# Patient Record
Sex: Female | Born: 1985 | Race: White | Hispanic: No | Marital: Single | Smoking: Current every day smoker
Health system: Southern US, Community
[De-identification: ages and names within clinical notes are randomized; demographics above are authoritative.]

## PROBLEM LIST (undated history)

## (undated) ENCOUNTER — Ambulatory Visit (HOSPITAL_COMMUNITY): Admission: EM | Payer: Medicaid Other | Source: Home / Self Care

## (undated) ENCOUNTER — Inpatient Hospital Stay (HOSPITAL_COMMUNITY): Payer: Self-pay

## (undated) DIAGNOSIS — N189 Chronic kidney disease, unspecified: Secondary | ICD-10-CM

## (undated) DIAGNOSIS — Z8619 Personal history of other infectious and parasitic diseases: Secondary | ICD-10-CM

## (undated) HISTORY — PX: WISDOM TOOTH EXTRACTION: SHX21

## (undated) HISTORY — DX: Personal history of other infectious and parasitic diseases: Z86.19

## (undated) HISTORY — PX: PLACEMENT OF BREAST IMPLANTS: SHX6334

---

## 2012-07-26 ENCOUNTER — Emergency Department (HOSPITAL_COMMUNITY)
Admission: EM | Admit: 2012-07-26 | Discharge: 2012-07-26 | Disposition: A | Discharge status: Home / Self Care | Payer: Self-pay | Attending: Emergency Medicine | Admitting: Emergency Medicine

## 2012-07-26 ENCOUNTER — Emergency Department (HOSPITAL_COMMUNITY): Payer: Self-pay

## 2012-07-26 ENCOUNTER — Encounter (HOSPITAL_COMMUNITY): Payer: Self-pay | Admitting: Physical Medicine and Rehabilitation

## 2012-07-26 DIAGNOSIS — R079 Chest pain, unspecified: Secondary | ICD-10-CM

## 2012-07-26 DIAGNOSIS — R6889 Other general symptoms and signs: Secondary | ICD-10-CM | POA: Insufficient documentation

## 2012-07-26 DIAGNOSIS — R0789 Other chest pain: Secondary | ICD-10-CM | POA: Insufficient documentation

## 2012-07-26 LAB — URINALYSIS, ROUTINE W REFLEX MICROSCOPIC
Bilirubin Urine: NEGATIVE
Hgb urine dipstick: NEGATIVE
Specific Gravity, Urine: 1.026 (ref 1.005–1.030)
Urobilinogen, UA: 0.2 mg/dL (ref 0.0–1.0)
pH: 6.5 (ref 5.0–8.0)

## 2012-07-26 LAB — COMPREHENSIVE METABOLIC PANEL
ALT: 84 U/L — ABNORMAL HIGH (ref 0–35)
AST: 149 U/L — ABNORMAL HIGH (ref 0–37)
Albumin: 3.9 g/dL (ref 3.5–5.2)
Alkaline Phosphatase: 106 U/L (ref 39–117)
Calcium: 9.2 mg/dL (ref 8.4–10.5)
GFR calc Af Amer: >90 mL/min (ref >90)
Glucose, Bld: 98 mg/dL (ref 70–99)
Potassium: 4 mEq/L (ref 3.5–5.1)
Sodium: 136 mEq/L (ref 135–145)
Total Protein: 7.3 g/dL (ref 6.0–8.3)

## 2012-07-26 LAB — CBC
Hemoglobin: 14.8 g/dL (ref 12.0–15.0)
MCH: 31.6 pg (ref 26.0–34.0)
MCHC: 35 g/dL (ref 30.0–36.0)
Platelets: 256 10*3/uL (ref 150–400)
RDW: 12.7 % (ref 11.5–15.5)
WBC: 12.1 10*3/uL — ABNORMAL HIGH (ref 4.0–10.5)

## 2012-07-26 MED ORDER — IBUPROFEN 400 MG PO TABS
600.0000 mg | ORAL_TABLET | Freq: Once | ORAL | Status: DC
  Filled 2012-07-26: qty 2

## 2012-07-26 MED ORDER — OXYCODONE-ACETAMINOPHEN 5-325 MG PO TABS: 1.0000 | ORAL_TABLET | Freq: Once | ORAL | Status: DC

## 2012-07-26 MED ORDER — ONDANSETRON 4 MG PO TBDP
8.0000 mg | ORAL_TABLET | Freq: Once | ORAL | Status: AC
  Administered 2012-07-26: 8 mg via ORAL
  Filled 2012-07-26: qty 2

## 2012-07-26 MED ORDER — GI COCKTAIL ~~LOC~~
30.0000 mL | Freq: Once | ORAL | Status: AC
  Administered 2012-07-26: 30 mL via ORAL
  Filled 2012-07-26: qty 30

## 2012-07-26 MED ORDER — ONDANSETRON HCL 4 MG PO TABS: 4.0000 mg | ORAL_TABLET | Freq: Four times a day (QID) | ORAL | Status: DC | PRN

## 2012-07-26 MED ORDER — OXYCODONE-ACETAMINOPHEN 5-325 MG PO TABS
1.0000 | ORAL_TABLET | Freq: Once | ORAL | Status: AC
  Administered 2012-07-26: 1 via ORAL
  Filled 2012-07-26: qty 1

## 2012-07-26 MED ORDER — OXYCODONE HCL ER 10 MG PO T12A: 10.0000 mg | EXTENDED_RELEASE_TABLET | Freq: Two times a day (BID) | ORAL | Status: DC

## 2012-07-26 NOTE — ED Notes (Signed)
Pt transported to ultrasound.

## 2012-07-26 NOTE — ED Notes (Signed)
Pt presents to department via [facility] for evaluation of midsternal chest pain radiating to back. Onset last night while sleeping, woke her up @2 :00am. 10/10 pain upon arrival, becomes worse with movement. Respirations unlabored. She is alert and oriented x4. 18g LAC. Received 324 ASA and 1 sublingual nitroglycerin without relief. Denies drug/ETOH abuse.

## 2012-07-26 NOTE — ED Provider Notes (Signed)
History     CSN: [id number]  Arrival date & time 07/26/12  7829   First MD Initiated Contact with Patient 07/26/12 [phone]      Chief Complaint  Patient presents with  . Chest Pain    (Consider location/radiation/quality/duration/timing/severity/associated sxs/prior treatment) HPI Comments: 26 year old female with no significant past medical history presents emergency department complaining of chest pain.  Onset of symptoms began around 2 a.m. after patient woke up.  She describes location of the pain to be in the epigastric and substernal area with radiation to her back.  Severity is rated at 10/10 and is exacerbated eye movement and deep inhalation.  Pain is described as a constant pressure associated with nausea and emesis x1.  Patient denies any shortness of breath, diaphoresis, PND, orthopnea, claudication, leg swelling, cough, hemoptysis, estrogen use, recent travel fever, night sweats, chills, drug or alcohol use, but reports occasional alcohol use. Patient received 324 ASA and 1 sublingual nitroglycerin be a mass in route without relief.  No other complaints at this time.   The history is provided by the patient.    No past medical history on file.  No past surgical history on file.  No family history on file.  History  Substance Use Topics  . Smoking status: Never Smoker   . Smokeless tobacco: Not on file  . Alcohol Use: No    OB History    Grav Para Term Preterm Abortions TAB SAB Ect Mult Living                  Review of Systems  Constitutional: Negative for fever, chills and appetite change.  HENT: Negative for congestion.   Eyes: Negative for visual disturbance.  Respiratory: Negative for shortness of breath.   Cardiovascular: Positive for chest pain. Negative for leg swelling.  Gastrointestinal: Positive for abdominal pain (epigastric).  Genitourinary: Negative for dysuria, urgency and frequency.  Neurological: Negative for dizziness, syncope, weakness,  light-headedness, numbness and headaches.  Psychiatric/Behavioral: Negative for confusion.    Allergies  Review of patient's allergies indicates no known allergies.  Home Medications   Current Outpatient Rx  Name Route Sig Dispense Refill  . ACETAMINOPHEN 500 MG PO TABS Oral Take 500 mg by mouth every 6 (six) hours as needed. Pain      BP 123/79  Pulse 65  Temp 98.3 F (36.8 C) (Oral)  Resp 23  SpO2 100%  LMP 07/05/2012  Physical Exam  Nursing note and vitals reviewed. Constitutional: She appears well-developed and well-nourished. No distress.  HENT:  Head: Normocephalic and atraumatic.  Eyes: Conjunctivae normal and EOM are normal. Pupils are equal, round, and reactive to light.  Neck: Normal range of motion. Neck supple. Normal carotid pulses and no JVD present. Carotid bruit is not present. No rigidity. Normal range of motion present.  Cardiovascular: Normal rate, regular rhythm, S1 normal, S2 normal, normal heart sounds, intact distal pulses and normal pulses.  Exam reveals no gallop and no friction rub.   No murmur heard.      No pitting edema bilaterally, RRR, no aberrant sounds on auscultations, distal pulses intact, no carotid bruit or JVD.   Pulmonary/Chest: Effort normal and breath sounds normal. No accessory muscle usage or stridor. No respiratory distress. She exhibits no tenderness and no bony tenderness.  Abdominal: Bowel sounds are normal. There is tenderness in the epigastric area.         Soft. Non pulsatile aorta.   Skin: Skin is warm, dry and intact. No  rash noted. She is not diaphoretic. No cyanosis. Nails show no clubbing.    ED Course  Procedures (including critical care time)  Labs Reviewed  URINALYSIS, ROUTINE W REFLEX MICROSCOPIC - Abnormal; Notable for the following:    Ketones, ur 15 (*)     All other components within normal limits  CBC - Abnormal; Notable for the following:    WBC 12.1 (*)     All other components within normal limits    COMPREHENSIVE METABOLIC PANEL - Abnormal; Notable for the following:    AST 149 (*)     ALT 84 (*)     GFR calc non Af Amer 87 (*)     All other components within normal limits  PREGNANCY, URINE  LIPASE, BLOOD  HEPATITIS PANEL, ACUTE   Dg Chest 2 View  07/26/2012  *RADIOLOGY REPORT*  Clinical Data: History of left chest pain.  History of shortness of breath and smoking.  CHEST - 2 VIEW  Comparison: None.  Findings: The cardiac silhouette is normal size and shape. Mediastinal and hilar contours appear stable.  No pneumothorax is evident.  No pulmonary infiltrates or masses are seen. No pleural abnormality is evident.  No skeletal lesions are evident.  IMPRESSION: No acute or active cardiopulmonary or pleural abnormalities are evident.   Original Report Authenticated By: Crawford Givens, M.D.    US Abdomen Complete  07/26/2012  *RADIOLOGY REPORT*  Clinical Data:  Epigastric pain  COMPLETE ABDOMINAL ULTRASOUND  Comparison:  None.  Findings:  Gallbladder:  No gallstones, gallbladder wall thickening, or pericholecystic fluid.  Common bile duct:  Normal at 5 mm  Liver:  No focal lesion identified.  Within normal limits in parenchymal echogenicity.  IVC:  Appears normal.  Pancreas:  No focal abnormality seen.  Spleen:  Normal in size and echogenicity.  Right Kidney:  13.5cm in length.  No evidence of hydronephrosis or stones. Potential duplicated collecting system  Left Kidney:  Moderate hydronephrosis. No stone identified.  Abdominal aorta:  Normal  Additional findings:  The bladder is collapsed.  IMPRESSION:  1.  Moderate  left hydronephrosis. 2.  Bladder is collapsed. 3.  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 4.  Normal gallbladder.   Original Report Authenticated By: Genevive Bi, M.D.      No diagnosis found.   Date: 07/26/2012  Rate: 57  Rhythm: normal sinus rhythm  QRS Axis: right, borderline  Intervals: normal  ST/T Wave abnormalities: normal  Conduction Disutrbances:  none  Narrative Interpretation:   Old EKG Reviewed: No old ECG  MDM  CP Patient is to be discharged with recommendation to follow up with PCP in regards to today's hospital visit for a re-check of her LFTs. Advised avoidance of acetaminophen and return if jaundice develops or worsening symptoms. Chest pain is not likely of cardiac or pulmonary etiology d/t presentation, perc negative, VSS, no tracheal deviation, no JVD or new murmur, RRR, breath sounds equal bilaterally, EKG without acute abnormalities, and negative CXR. Pt appears reliable for follow up and is agreeable to discharge.   Case has been discussed with and seen by Dr. Radford Pax who agrees with the above plan to discharge.          Jaci Carrel, [place] 07/26/12 1607

## 2012-07-27 LAB — HEPATITIS PANEL, ACUTE
HCV Ab: NEGATIVE
Hep A IgM: NEGATIVE
Hep B C IgM: NEGATIVE

## 2012-07-28 NOTE — ED Provider Notes (Signed)
Medical screening examination/treatment/procedure(s) were performed by non-physician practitioner and as supervising physician I was immediately available for consultation/collaboration.    Nelia Shi, MD 07/28/12 [phone]

## 2012-08-01 ENCOUNTER — Telehealth (HOSPITAL_COMMUNITY): Payer: Self-pay | Admitting: *Deleted

## 2012-08-01 NOTE — ED Notes (Signed)
Pt. called for Hepatitis results.  Pt. verified x 2 and given neg. results. Pt. said she was told to get her liver result retested in 1 week. When notes accessed I saw that pt. was seen in the ED.  Does not have a PCP or insurance. I told her she could come back here tomorrow. No appointment necessary. Vassie Moselle 08/01/2012

## 2013-05-12 DIAGNOSIS — N390 Urinary tract infection, site not specified: Secondary | ICD-10-CM | POA: Insufficient documentation

## 2013-06-23 ENCOUNTER — Inpatient Hospital Stay (HOSPITAL_COMMUNITY)
Admission: AD | Admit: 2013-06-23 | Discharge: 2013-06-23 | Disposition: A | Discharge status: Home / Self Care | Payer: Self-pay | Source: Ambulatory Visit | Attending: Obstetrics & Gynecology | Admitting: Obstetrics & Gynecology

## 2013-06-23 ENCOUNTER — Inpatient Hospital Stay (HOSPITAL_COMMUNITY): Payer: Self-pay

## 2013-06-23 ENCOUNTER — Encounter (HOSPITAL_COMMUNITY): Payer: Self-pay | Admitting: *Deleted

## 2013-06-23 DIAGNOSIS — O418X10 Other specified disorders of amniotic fluid and membranes, first trimester, not applicable or unspecified: Secondary | ICD-10-CM

## 2013-06-23 DIAGNOSIS — O209 Hemorrhage in early pregnancy, unspecified: Secondary | ICD-10-CM | POA: Insufficient documentation

## 2013-06-23 DIAGNOSIS — R109 Unspecified abdominal pain: Secondary | ICD-10-CM | POA: Insufficient documentation

## 2013-06-23 DIAGNOSIS — O468X9 Other antepartum hemorrhage, unspecified trimester: Secondary | ICD-10-CM

## 2013-06-23 HISTORY — DX: Chronic kidney disease, unspecified: N18.9

## 2013-06-23 LAB — WET PREP, GENITAL: Clue Cells Wet Prep HPF POC: NONE SEEN

## 2013-06-23 LAB — URINALYSIS, ROUTINE W REFLEX MICROSCOPIC
Bilirubin Urine: NEGATIVE
Glucose, UA: NEGATIVE mg/dL
Hgb urine dipstick: NEGATIVE
Leukocytes, UA: NEGATIVE
Specific Gravity, Urine: 1.01 (ref 1.005–1.030)
Urobilinogen, UA: 0.2 mg/dL (ref 0.0–1.0)
pH: 7 (ref 5.0–8.0)

## 2013-06-23 LAB — HCG, QUANTITATIVE, PREGNANCY: hCG, Beta Chain, Quant, S: 129845 m[IU]/mL — ABNORMAL HIGH (ref <5)

## 2013-06-23 LAB — CBC
Hemoglobin: 12.7 g/dL (ref 12.0–15.0)
MCH: 31.1 pg (ref 26.0–34.0)
MCHC: 34.6 g/dL (ref 30.0–36.0)
RDW: 11.9 % (ref 11.5–15.5)

## 2013-06-23 LAB — ABO/RH: ABO/RH(D): O POS

## 2013-06-23 MED ORDER — ONDANSETRON 8 MG PO TBDP
8.0000 mg | ORAL_TABLET | Freq: Once | ORAL | Status: AC
  Administered 2013-06-23: 8 mg via ORAL
  Filled 2013-06-23: qty 1

## 2013-06-23 NOTE — MAU Provider Note (Signed)
History     CSN: [id number]  Arrival date and time: 06/23/13 1547   First Provider Initiated Contact with Patient 06/23/13 1705      Chief Complaint  Patient presents with  . Vaginal Bleeding  . Abdominal Cramping   HPI This is a 26 y.o. female at [redacted]w[redacted]d who presents with c/o bleeding and cramping for a week. States pain is getting worse. Has not been seen for this pregnancy yet.   RN note: Having a lot of cramping and has been bleeding off and on for a wk. Pain is getting worse. preg confirmed at [facility] 2 wks ago. Unsure of LMP,? last wk in July.  Revision History...     OB History   Grav Para Term Preterm Abortions TAB SAB Ect Mult Living   3 2 2       2       Past Medical History  Diagnosis Date  . Chronic kidney disease     Past Surgical History  Procedure Laterality Date  . Placement of breast implants      No family history on file.  History  Substance Use Topics  . Smoking status: Former Smoker    Quit date: 06/05/2013  . Smokeless tobacco: Not on file  . Alcohol Use: No    Allergies: No Known Allergies  Prescriptions prior to admission  Medication Sig Dispense Refill  . naproxen sodium (ANAPROX) 220 MG tablet Take 220 mg by mouth 2 (two) times daily as needed (for pain).      . ondansetron (ZOFRAN) 4 MG tablet Take 1 tablet (4 mg total) by mouth every 6 (six) hours as needed for nausea.  6 tablet  0    Review of Systems  Constitutional: Negative for fever and malaise/fatigue.  Gastrointestinal: Positive for nausea and abdominal pain. Negative for vomiting, diarrhea and constipation.  Genitourinary:       Bleeding    Neurological: Negative for dizziness and weakness.   Physical Exam   Blood pressure 107/59, pulse 66, temperature 97.9 F (36.6 C), temperature source Oral, resp. rate 20, height 5' 1.5" (1.562 m), weight 50.803 kg (112 lb), last menstrual period 04/27/2013.  Physical Exam  Constitutional: She is oriented to person, place, and  time. She appears well-developed and well-nourished. No distress.  HENT:  Head: Normocephalic.  Cardiovascular: Normal rate.   Respiratory: Effort normal.  GI: Soft. She exhibits no distension and no mass. There is tenderness (slight over suprapubic area). There is no rebound and no guarding.  Genitourinary: Vagina normal and uterus normal. No vaginal discharge (no blood seen, cervix long and closed) found.  Musculoskeletal: Normal range of motion.  Neurological: She is alert and oriented to person, place, and time.  Skin: Skin is warm and dry.  Psychiatric: She has a normal mood and affect.    MAU Course  Procedures  MDM Cultures done. Will send labs and get [place]  >>  US shows live SIUP at [redacted]w[redacted]d, small [facility]  FHR 133  Results for orders placed during the hospital encounter of 06/23/13 (from the past 24 hour(s))  POCT PREGNANCY, URINE     Status: Abnormal   Collection Time    06/23/13  4:09 PM      Result Value Range   Preg Test, Ur POSITIVE (*) NEGATIVE  HCG, QUANTITATIVE, PREGNANCY     Status: Abnormal   Collection Time    06/23/13  4:37 PM      Result Value Range   hCG, Beta Chain,  [place] Longest 161096 (*) <5 mIU/mL  ABO/RH     Status: None   Collection Time    06/23/13  4:37 PM      Result Value Range   ABO/RH(D) O POS    CBC     Status: None   Collection Time    06/23/13  4:37 PM      Result Value Range   WBC 9.5  4.0 - 10.5 K/uL   RBC 4.08  3.87 - 5.11 MIL/uL   Hemoglobin 12.7  12.0 - 15.0 g/dL   HCT 04.5  40.9 - 81.1 %   MCV 90.0  78.0 - 100.0 fL   MCH 31.1  26.0 - 34.0 pg   MCHC 34.6  30.0 - 36.0 g/dL   RDW 91.4  78.2 - 95.6 %   Platelets 228  150 - 400 K/uL  WET PREP, GENITAL     Status: Abnormal   Collection Time    06/23/13  5:15 PM      Result Value Range   Yeast Wet Prep HPF POC NONE SEEN  NONE SEEN   Trich, Wet Prep NONE SEEN  NONE SEEN   Clue Cells Wet Prep HPF POC NONE SEEN  NONE SEEN   WBC, Wet Prep HPF POC FEW (*) NONE SEEN     Assessment and  Plan  A:  SIUP at 7.1 weeks      Small SCH  P:  Discharge home      Followup with prenatal care  [facility] 06/23/2013, 5:05 PM

## 2013-06-23 NOTE — MAU Note (Addendum)
Having a lot of cramping and has been bleeding off and on for a wk.  Pain is getting worse.  preg confirmed at [facility] 2 wks ago. Unsure of LMP,? last wk in July.

## 2013-06-25 LAB — GC/CHLAMYDIA PROBE AMP: CT Probe RNA: NEGATIVE

## 2013-10-05 NOTE — L&D Delivery Note (Addendum)
Delivery Note At 8:23 AM a viable female, "Angela Mercado", was delivered via  (Presentation: ROA).  APGAR: , ; weight .   Placenta status: Spontaneous, intact.  Cord:  with the following complications:  CAN x 1, reduced over shoulder at delivery..  Cord pH: NA DTR 1-2+, denies SOB, chest pain, or sedation.  Anesthesia:  Epidural Episiotomy: None Lacerations: None Suture Repair: None Est. Blood Loss (mL):  200  Mom to AICU for magnesium sulfate x 24 hours after delivery.  Baby to Couplet care / Skin to Skin. Patient desires inpatient circumcision--reviewed arrangements to be made with office and hospital. Patient wants epidural cath removed. Patient now uncertain regarding BTL--will defer during this hospitalization. Will discuss contraception during pp stay.  Angela Mercado, Angela Mercado 07/05/2014, 8:54 AM

## 2013-11-03 LAB — OB RESULTS CONSOLE ANTIBODY SCREEN: Antibody Screen: NEGATIVE

## 2013-11-03 LAB — OB RESULTS CONSOLE RPR: RPR: NONREACTIVE

## 2013-11-03 LAB — OB RESULTS CONSOLE GC/CHLAMYDIA
Chlamydia: NEGATIVE
Gonorrhea: NEGATIVE

## 2013-11-03 LAB — OB RESULTS CONSOLE ABO/RH: RH TYPE: POSITIVE

## 2013-11-03 LAB — OB RESULTS CONSOLE HIV ANTIBODY (ROUTINE TESTING): HIV: NONREACTIVE

## 2013-11-03 LAB — OB RESULTS CONSOLE RUBELLA ANTIBODY, IGM: Rubella: NON-IMMUNE/NOT IMMUNE

## 2013-11-03 LAB — OB RESULTS CONSOLE HEPATITIS B SURFACE ANTIGEN: Hepatitis B Surface Ag: NEGATIVE

## 2014-04-04 ENCOUNTER — Encounter: Payer: Self-pay | Admitting: *Deleted

## 2014-04-28 ENCOUNTER — Encounter (HOSPITAL_COMMUNITY): Payer: Self-pay | Admitting: *Deleted

## 2014-07-03 ENCOUNTER — Telehealth (HOSPITAL_COMMUNITY): Payer: Self-pay | Admitting: *Deleted

## 2014-07-03 ENCOUNTER — Encounter (HOSPITAL_COMMUNITY): Payer: Self-pay | Admitting: *Deleted

## 2014-07-03 LAB — OB RESULTS CONSOLE GBS: GBS: POSITIVE

## 2014-07-03 NOTE — Telephone Encounter (Signed)
Preadmission screen  

## 2014-07-04 ENCOUNTER — Encounter (HOSPITAL_COMMUNITY): Payer: Self-pay

## 2014-07-04 ENCOUNTER — Inpatient Hospital Stay (HOSPITAL_COMMUNITY)
Admission: RE | Admit: 2014-07-04 | Discharge: 2014-07-07 | DRG: 775 | Disposition: A | Discharge status: Home / Self Care | Payer: Medicaid Other | Source: Ambulatory Visit | Attending: Obstetrics and Gynecology | Admitting: Obstetrics and Gynecology

## 2014-07-04 VITALS — BP 106/59 | HR 73 | Temp 98.5°F | Resp 18 | Ht 62.75 in | Wt 130.4 lb

## 2014-07-04 DIAGNOSIS — Z2839 Other underimmunization status: Secondary | ICD-10-CM | POA: Insufficient documentation

## 2014-07-04 DIAGNOSIS — O09899 Supervision of other high risk pregnancies, unspecified trimester: Secondary | ICD-10-CM

## 2014-07-04 DIAGNOSIS — I959 Hypotension, unspecified: Secondary | ICD-10-CM | POA: Diagnosis not present

## 2014-07-04 DIAGNOSIS — N189 Chronic kidney disease, unspecified: Secondary | ICD-10-CM | POA: Diagnosis present

## 2014-07-04 DIAGNOSIS — Z3A41 41 weeks gestation of pregnancy: Secondary | ICD-10-CM | POA: Diagnosis present

## 2014-07-04 DIAGNOSIS — O99824 Streptococcus B carrier state complicating childbirth: Secondary | ICD-10-CM | POA: Diagnosis present

## 2014-07-04 DIAGNOSIS — F17200 Nicotine dependence, unspecified, uncomplicated: Secondary | ICD-10-CM | POA: Insufficient documentation

## 2014-07-04 DIAGNOSIS — O26833 Pregnancy related renal disease, third trimester: Secondary | ICD-10-CM | POA: Diagnosis present

## 2014-07-04 DIAGNOSIS — Z9882 Breast implant status: Secondary | ICD-10-CM

## 2014-07-04 DIAGNOSIS — Z87891 Personal history of nicotine dependence: Secondary | ICD-10-CM | POA: Diagnosis not present

## 2014-07-04 DIAGNOSIS — B951 Streptococcus, group B, as the cause of diseases classified elsewhere: Secondary | ICD-10-CM | POA: Diagnosis present

## 2014-07-04 DIAGNOSIS — Z283 Underimmunization status: Secondary | ICD-10-CM | POA: Insufficient documentation

## 2014-07-04 DIAGNOSIS — O9989 Other specified diseases and conditions complicating pregnancy, childbirth and the puerperium: Secondary | ICD-10-CM

## 2014-07-04 DIAGNOSIS — O09299 Supervision of pregnancy with other poor reproductive or obstetric history, unspecified trimester: Secondary | ICD-10-CM

## 2014-07-04 DIAGNOSIS — O480 Post-term pregnancy: Principal | ICD-10-CM | POA: Diagnosis present

## 2014-07-04 LAB — CBC
HCT: 36.4 % (ref 36.0–46.0)
Hemoglobin: 12.4 g/dL (ref 12.0–15.0)
MCH: 30.7 pg (ref 26.0–34.0)
MCHC: 34.1 g/dL (ref 30.0–36.0)
MCV: 90.1 fL (ref 78.0–100.0)
PLATELETS: 229 10*3/uL (ref 150–400)
RBC: 4.04 MIL/uL (ref 3.87–5.11)
RDW: 13.4 % (ref 11.5–15.5)
WBC: 9.8 10*3/uL (ref 4.0–10.5)

## 2014-07-04 LAB — COMPREHENSIVE METABOLIC PANEL
ALK PHOS: 262 U/L — AB (ref 39–117)
ALT: 8 U/L (ref 0–35)
ANION GAP: 13 (ref 5–15)
AST: 14 U/L (ref 0–37)
Albumin: 3 g/dL — ABNORMAL LOW (ref 3.5–5.2)
BUN: 6 mg/dL (ref 6–23)
CHLORIDE: 101 meq/L (ref 96–112)
CO2: 23 meq/L (ref 19–32)
Calcium: 9.3 mg/dL (ref 8.4–10.5)
Creatinine, Ser: 0.68 mg/dL (ref 0.50–1.10)
GLUCOSE: 82 mg/dL (ref 70–99)
POTASSIUM: 4.1 meq/L (ref 3.7–5.3)
SODIUM: 137 meq/L (ref 137–147)
Total Protein: 6.9 g/dL (ref 6.0–8.3)

## 2014-07-04 LAB — TYPE AND SCREEN
ABO/RH(D): O POS
Antibody Screen: NEGATIVE

## 2014-07-04 LAB — URIC ACID: Uric Acid, Serum: 3.7 mg/dL (ref 2.4–7.0)

## 2014-07-04 LAB — LACTATE DEHYDROGENASE: LDH: 152 U/L (ref 94–250)

## 2014-07-04 MED ORDER — MISOPROSTOL 25 MCG QUARTER TABLET
25.0000 ug | ORAL_TABLET | ORAL | Status: DC | PRN
  Filled 2014-07-04: qty 1

## 2014-07-04 MED ORDER — OXYCODONE-ACETAMINOPHEN 5-325 MG PO TABS: 1.0000 | ORAL_TABLET | ORAL | Status: DC | PRN

## 2014-07-04 MED ORDER — LIDOCAINE HCL (PF) 1 % IJ SOLN
30.0000 mL | INTRAMUSCULAR | Status: DC | PRN
  Filled 2014-07-04: qty 30

## 2014-07-04 MED ORDER — OXYTOCIN 40 UNITS IN LACTATED RINGERS INFUSION - SIMPLE MED: 62.5000 mL/h | INTRAVENOUS | Status: DC

## 2014-07-04 MED ORDER — LACTATED RINGERS IV SOLN: 500.0000 mL | INTRAVENOUS | Status: DC | PRN

## 2014-07-04 MED ORDER — PENICILLIN G POTASSIUM 5000000 UNITS IJ SOLR
2.5000 10*6.[IU] | INTRAVENOUS | Status: DC
  Filled 2014-07-04 (×2): qty 2.5

## 2014-07-04 MED ORDER — OXYTOCIN BOLUS FROM INFUSION
500.0000 mL | INTRAVENOUS | Status: DC
  Administered 2014-07-05: 500 mL via INTRAVENOUS

## 2014-07-04 MED ORDER — LACTATED RINGERS IV SOLN
INTRAVENOUS | Status: DC
  Administered 2014-07-04 – 2014-07-05 (×2): via INTRAVENOUS

## 2014-07-04 MED ORDER — TERBUTALINE SULFATE 1 MG/ML IJ SOLN: 0.2500 mg | Freq: Once | INTRAMUSCULAR | Status: AC | PRN

## 2014-07-04 MED ORDER — CITRIC ACID-SODIUM CITRATE 334-500 MG/5ML PO SOLN
30.0000 mL | ORAL | Status: DC | PRN
  Administered 2014-07-05: 30 mL via ORAL
  Filled 2014-07-04: qty 15

## 2014-07-04 MED ORDER — OXYTOCIN 40 UNITS IN LACTATED RINGERS INFUSION - SIMPLE MED
1.0000 m[IU]/min | INTRAVENOUS | Status: DC
Start: 2014-07-04 — End: 2014-07-05
  Administered 2014-07-05: 2 m[IU]/min via INTRAVENOUS
  Administered 2014-07-05: 14 m[IU]/min via INTRAVENOUS
  Administered 2014-07-05: 8 m[IU]/min via INTRAVENOUS
  Filled 2014-07-04: qty 1000

## 2014-07-04 MED ORDER — NALBUPHINE HCL 10 MG/ML IJ SOLN
5.0000 mg | INTRAMUSCULAR | Status: DC | PRN
  Administered 2014-07-05: 5 mg via INTRAVENOUS
  Filled 2014-07-04: qty 1

## 2014-07-04 MED ORDER — ONDANSETRON HCL 4 MG/2ML IJ SOLN
4.0000 mg | Freq: Four times a day (QID) | INTRAMUSCULAR | Status: DC | PRN
  Administered 2014-07-05: 4 mg via INTRAVENOUS
  Filled 2014-07-04: qty 2

## 2014-07-04 MED ORDER — ACETAMINOPHEN 325 MG PO TABS
650.0000 mg | ORAL_TABLET | ORAL | Status: DC | PRN
Start: 2014-07-04 — End: 2014-07-05
  Administered 2014-07-05: 650 mg via ORAL
  Filled 2014-07-04: qty 2

## 2014-07-04 MED ORDER — OXYCODONE-ACETAMINOPHEN 5-325 MG PO TABS
2.0000 | ORAL_TABLET | ORAL | Status: DC | PRN
  Administered 2014-07-05: 2 via ORAL
  Filled 2014-07-04: qty 2

## 2014-07-04 MED ORDER — DEXTROSE 5 % IV SOLN
5.0000 10*6.[IU] | Freq: Once | INTRAVENOUS | Status: DC
  Filled 2014-07-04: qty 5

## 2014-07-04 NOTE — H&P (Signed)
Angela Mercado is a 28 y.o. female, G4 P2012 at 41.0 weeks, IOL for PD and PP BTL   Patient Active Problem List   Diagnosis Date Noted  . Smoker 07/04/2014  . Rubella non-immune status, antepartum 07/04/2014    Pregnancy Course: Patient entered care at 10.0 weeks.   EDC of 06/27/14 was established by LMP.      US evaluations:   13.6 weeks - 1st Trimester screen: WNL, FHR 163, anterverted uterus, anterior placenta  18.0 weeks - Anatomy: EFW 8oz - 44%tile,  FHR 148, anatomy wnl, cervical length 3.08, vertex, anterior    placenta, normal cord insertion, normal fluid, cervix closed, female    Significant prenatal events:   GBS+, BV+,  Last evaluation:   38.0 weeks   VE:1/30/-2 on 06/27/14  Reason for admission:  IOL for PD w/BTL  Pt States:   Contractions Frequency: none         Contraction severity: n/a         Fetal activity: +FM  OB History   Grav Para Term Preterm Abortions TAB SAB Ect Mult Living   4 2 2  1  1   2      Past Medical History  Diagnosis Date  . Chronic kidney disease   . Hx of gonorrhea   . Hx of chlamydia infection   . History of HPV infection   . History of syphilis    Past Surgical History  Procedure Laterality Date  . Placement of breast implants    . Wisdom tooth extraction     Family History: family history is not on file. Social History:  reports that she quit smoking about 12 months ago. She does not have any smokeless tobacco history on file. She reports that she does not drink alcohol or use illicit drugs.   Prenatal Transfer Tool  Maternal Diabetes: No Genetic Screening: Normal Maternal Ultrasounds/Referrals: Normal Fetal Ultrasounds or other Referrals:  None Maternal Substance Abuse:  No Significant Maternal Medications:  None Significant Maternal Lab Results: Lab values include: Group B Strep positive, Other: RNI   ROS:  See HPI above, all other systems are negative  No Known Allergies    Last menstrual period 09/20/2013, unknown if  currently breastfeeding.  Maternal Exam:  Uterine Assessment: Contraction frequency is rare.  Abdomen: Gravid, non tender. Fundal height is aga.  Normal external genitalia, vulva, cervix, uterus and adnexa.  No lesions noted on exam.  Pelvis adequate for delivery.  Fetal presentation: Vertex by VE  Fetal Exam:  Monitor Surveillance continious Monitoring Mode: Ultrasound.  NICHD: Category 1 CTXs: q3-4 EFW   7lbs  Physical Exam: Nursing note and vitals reviewed General: alert and cooperative She appears well nourished Psychiatric: Normal mood and affect. Her behavior is normal Head: Normocephalic Eyes: Pupils are equal, round, and reactive to light Neck: Normal range of motion Cardiovascular: RRR without murmur  Respiratory: CTAB. Effort normal  Abd: soft, non-tender, +BS, no rebound, no guarding  Genitourinary: Vagina normal  Neurological: A&Ox3 Skin: Warm and dry  Musculoskeletal: Normal range of motion  Homan's sign negative bilaterally No evidence of DVTs.  Edema: Minimal bilaterally non-pitting edema DTR: 2+ Clonus: None   Prenatal labs: ABO, Rh: O/Positive/-- (01/30 0000) Antibody: Negative (01/30 0000) Rubella:   Non Immune RPR: Nonreactive (01/30 0000)  HBsAg: Negative (01/30 0000)  HIV: Non-reactive (01/30 0000)  GBS: Positive (09/29 0000) Sickle cell/Hgb electrophoresis:  WNL Pap:   GC:   negative Chlamydia: negative Genetic screenings:  negative Glucola:  wnl  Routine L&D orders Regular diet prior to starting pitocin Clear/Thin diet after pitocin starts Anticipate SVD  Assessment:  IUP at 41.0 weeks NICHD: Category Membranes: intact SVE 2-3/50/-2 moderate - firm Bishop Score: 5 GBS positive   Plan:  Admit to L&D for IOL d/t PD IOL options reviewed with patient including cytotec, foley bulb, AROM, and pitocin R&B of IOL reviewed including serial induction, failure, and/or CS requirement Pt and family verbalize understanding and agree  with treatment plan.  Start induction with foley bulb GBS prophylaxis with PCN G per Draven Laine dosing with onset of active labor.  Okay to ambulate around unit with wireless monitors  Okay to get up and shower without monitoring  Regular diet prior to starting pitocin Clear/Thin diet after pitocin starts  Start pitocin when foley bulb dislodges  Continue labor mgmt as ordered   IV pain medication per orders PRN Epidural per patient request Foley cath after patient is comfortable with epidural  Anticipate SVD Attending MD available at all times.  PIH labs   Gearlene Godsil, CNM, MSN 07/04/2014, 11:51 AM

## 2014-07-05 ENCOUNTER — Encounter (HOSPITAL_COMMUNITY): Payer: Self-pay

## 2014-07-05 ENCOUNTER — Encounter (HOSPITAL_COMMUNITY): Payer: Medicaid Other | Admitting: Anesthesiology

## 2014-07-05 ENCOUNTER — Inpatient Hospital Stay (HOSPITAL_COMMUNITY): Payer: Medicaid Other | Admitting: Anesthesiology

## 2014-07-05 DIAGNOSIS — B951 Streptococcus, group B, as the cause of diseases classified elsewhere: Secondary | ICD-10-CM | POA: Diagnosis present

## 2014-07-05 DIAGNOSIS — Z9882 Breast implant status: Secondary | ICD-10-CM

## 2014-07-05 DIAGNOSIS — O09299 Supervision of pregnancy with other poor reproductive or obstetric history, unspecified trimester: Secondary | ICD-10-CM

## 2014-07-05 LAB — CBC
HCT: 34.6 % — ABNORMAL LOW (ref 36.0–46.0)
HEMATOCRIT: 35.6 % — AB (ref 36.0–46.0)
HEMOGLOBIN: 12 g/dL (ref 12.0–15.0)
HEMOGLOBIN: 11.5 g/dL — AB (ref 12.0–15.0)
MCH: 30.5 pg (ref 26.0–34.0)
MCH: 30 pg (ref 26.0–34.0)
MCHC: 33.7 g/dL (ref 30.0–36.0)
MCHC: 33.2 g/dL (ref 30.0–36.0)
MCV: 90.4 fL (ref 78.0–100.0)
MCV: 90.3 fL (ref 78.0–100.0)
PLATELETS: 219 10*3/uL (ref 150–400)
Platelets: 242 10*3/uL (ref 150–400)
RBC: 3.94 MIL/uL (ref 3.87–5.11)
RBC: 3.83 MIL/uL — ABNORMAL LOW (ref 3.87–5.11)
RDW: 13.3 % (ref 11.5–15.5)
RDW: 13.4 % (ref 11.5–15.5)
WBC: 15.2 10*3/uL — AB (ref 4.0–10.5)
WBC: 18.3 10*3/uL — AB (ref 4.0–10.5)

## 2014-07-05 LAB — RPR

## 2014-07-05 LAB — PROTEIN / CREATININE RATIO, URINE
Creatinine, Urine: 37.81 mg/dL
Creatinine, Urine: 132.25 mg/dL
PROTEIN CREATININE RATIO: 0.55 — AB (ref 0.00–0.15)
Protein Creatinine Ratio: 1.45 — ABNORMAL HIGH (ref 0.00–0.15)
TOTAL PROTEIN, URINE: 72.2 mg/dL
Total Protein, Urine: 55 mg/dL

## 2014-07-05 LAB — HIV ANTIBODY (ROUTINE TESTING W REFLEX): HIV 1&2 Ab, 4th Generation: NONREACTIVE

## 2014-07-05 MED ORDER — DIPHENHYDRAMINE HCL 50 MG/ML IJ SOLN: 12.5000 mg | INTRAMUSCULAR | Status: DC | PRN

## 2014-07-05 MED ORDER — PHENYLEPHRINE 40 MCG/ML (10ML) SYRINGE FOR IV PUSH (FOR BLOOD PRESSURE SUPPORT)
80.0000 ug | PREFILLED_SYRINGE | INTRAVENOUS | Status: DC | PRN
  Filled 2014-07-05: qty 10
  Filled 2014-07-05: qty 2

## 2014-07-05 MED ORDER — EPHEDRINE 5 MG/ML INJ
10.0000 mg | INTRAVENOUS | Status: DC | PRN
  Filled 2014-07-05: qty 2

## 2014-07-05 MED ORDER — OXYCODONE-ACETAMINOPHEN 5-325 MG PO TABS
2.0000 | ORAL_TABLET | ORAL | Status: DC | PRN
  Administered 2014-07-05 – 2014-07-07 (×6): 2 via ORAL
  Filled 2014-07-05 (×7): qty 2

## 2014-07-05 MED ORDER — IBUPROFEN 600 MG PO TABS
600.0000 mg | ORAL_TABLET | Freq: Four times a day (QID) | ORAL | Status: DC
  Administered 2014-07-05 – 2014-07-07 (×8): 600 mg via ORAL
  Filled 2014-07-05 (×8): qty 1

## 2014-07-05 MED ORDER — PRENATAL MULTIVITAMIN CH
1.0000 | ORAL_TABLET | Freq: Every day | ORAL | Status: DC
  Administered 2014-07-05 – 2014-07-07 (×3): 1 via ORAL
  Filled 2014-07-05 (×3): qty 1

## 2014-07-05 MED ORDER — DIPHENHYDRAMINE HCL 25 MG PO CAPS: 25.0000 mg | ORAL_CAPSULE | Freq: Four times a day (QID) | ORAL | Status: DC | PRN

## 2014-07-05 MED ORDER — MAGNESIUM SULFATE 40 G IN LACTATED RINGERS - SIMPLE: 2.0000 g/h | INTRAVENOUS | Status: DC

## 2014-07-05 MED ORDER — MAGNESIUM SULFATE 40 G IN LACTATED RINGERS - SIMPLE
2.0000 g/h | INTRAVENOUS | Status: DC
  Filled 2014-07-05: qty 500

## 2014-07-05 MED ORDER — DEXTROSE 5 % IV SOLN
2.5000 10*6.[IU] | INTRAVENOUS | Status: DC
  Administered 2014-07-05: 2.5 10*6.[IU] via INTRAVENOUS
  Filled 2014-07-05 (×4): qty 2.5

## 2014-07-05 MED ORDER — NALBUPHINE HCL 10 MG/ML IJ SOLN
10.0000 mg | INTRAMUSCULAR | Status: DC | PRN
  Administered 2014-07-05: 10 mg via INTRAVENOUS
  Filled 2014-07-05: qty 1

## 2014-07-05 MED ORDER — ONDANSETRON HCL 4 MG/2ML IJ SOLN: 4.0000 mg | INTRAMUSCULAR | Status: DC | PRN

## 2014-07-05 MED ORDER — MAGNESIUM SULFATE 40 G IN LACTATED RINGERS - SIMPLE: 4.0000 g/h | Freq: Once | INTRAVENOUS | Status: DC

## 2014-07-05 MED ORDER — MAGNESIUM SULFATE BOLUS VIA INFUSION
4.0000 g | Freq: Once | INTRAVENOUS | Status: AC
  Administered 2014-07-05: 4 g via INTRAVENOUS
  Filled 2014-07-05: qty 500

## 2014-07-05 MED ORDER — BUTORPHANOL TARTRATE 1 MG/ML IJ SOLN
1.0000 mg | INTRAMUSCULAR | Status: DC | PRN
Start: 2014-07-05 — End: 2014-07-05

## 2014-07-05 MED ORDER — LIDOCAINE HCL (PF) 1 % IJ SOLN
INTRAMUSCULAR | Status: DC | PRN
  Administered 2014-07-05 (×2): 9 mL

## 2014-07-05 MED ORDER — DIBUCAINE 1 % RE OINT: 1.0000 "application " | TOPICAL_OINTMENT | RECTAL | Status: DC | PRN

## 2014-07-05 MED ORDER — ZOLPIDEM TARTRATE 5 MG PO TABS
5.0000 mg | ORAL_TABLET | Freq: Every evening | ORAL | Status: DC | PRN
  Administered 2014-07-05: 5 mg via ORAL
  Filled 2014-07-05: qty 1

## 2014-07-05 MED ORDER — LACTATED RINGERS IV SOLN
INTRAVENOUS | Status: DC
  Administered 2014-07-05: 11:00:00 via INTRAVENOUS
  Administered 2014-07-05: 100 mL via INTRAVENOUS

## 2014-07-05 MED ORDER — PHENYLEPHRINE 40 MCG/ML (10ML) SYRINGE FOR IV PUSH (FOR BLOOD PRESSURE SUPPORT)
80.0000 ug | PREFILLED_SYRINGE | INTRAVENOUS | Status: DC | PRN
  Administered 2014-07-05 (×2): 80 ug via INTRAVENOUS
  Filled 2014-07-05: qty 2

## 2014-07-05 MED ORDER — WITCH HAZEL-GLYCERIN EX PADS: 1.0000 "application " | MEDICATED_PAD | CUTANEOUS | Status: DC | PRN

## 2014-07-05 MED ORDER — TETANUS-DIPHTH-ACELL PERTUSSIS 5-2.5-18.5 LF-MCG/0.5 IM SUSP
0.5000 mL | Freq: Once | INTRAMUSCULAR | Status: AC
  Administered 2014-07-06: 0.5 mL via INTRAMUSCULAR
  Filled 2014-07-05 (×2): qty 0.5

## 2014-07-05 MED ORDER — SIMETHICONE 80 MG PO CHEW: 80.0000 mg | CHEWABLE_TABLET | ORAL | Status: DC | PRN

## 2014-07-05 MED ORDER — SENNOSIDES-DOCUSATE SODIUM 8.6-50 MG PO TABS
2.0000 | ORAL_TABLET | ORAL | Status: DC
  Administered 2014-07-06: 2 via ORAL
  Filled 2014-07-05: qty 2

## 2014-07-05 MED ORDER — BENZOCAINE-MENTHOL 20-0.5 % EX AERO: 1.0000 "application " | INHALATION_SPRAY | CUTANEOUS | Status: DC | PRN

## 2014-07-05 MED ORDER — OXYCODONE-ACETAMINOPHEN 5-325 MG PO TABS
1.0000 | ORAL_TABLET | ORAL | Status: DC | PRN
Start: 2014-07-05 — End: 2014-07-05

## 2014-07-05 MED ORDER — ONDANSETRON HCL 4 MG PO TABS: 4.0000 mg | ORAL_TABLET | ORAL | Status: DC | PRN

## 2014-07-05 MED ORDER — INFLUENZA VAC SPLIT QUAD 0.5 ML IM SUSY
0.5000 mL | PREFILLED_SYRINGE | INTRAMUSCULAR | Status: AC
  Administered 2014-07-06: 0.5 mL via INTRAMUSCULAR
  Filled 2014-07-05 (×2): qty 0.5

## 2014-07-05 MED ORDER — FENTANYL 2.5 MCG/ML BUPIVACAINE 1/10 % EPIDURAL INFUSION (WH - ANES)
14.0000 mL/h | INTRAMUSCULAR | Status: DC | PRN
  Administered 2014-07-05: 14 mL/h via EPIDURAL
  Filled 2014-07-05: qty 125

## 2014-07-05 MED ORDER — MAGNESIUM SULFATE 40 G IN LACTATED RINGERS - SIMPLE
2.0000 g/h | INTRAVENOUS | Status: DC
  Administered 2014-07-06: 2 g/h via INTRAVENOUS
  Filled 2014-07-05: qty 500

## 2014-07-05 MED ORDER — PENICILLIN G POTASSIUM 5000000 UNITS IJ SOLR
5.0000 10*6.[IU] | Freq: Once | INTRAVENOUS | Status: AC
  Administered 2014-07-05: 5 10*6.[IU] via INTRAVENOUS
  Filled 2014-07-05: qty 5

## 2014-07-05 MED ORDER — LANOLIN HYDROUS EX OINT
TOPICAL_OINTMENT | CUTANEOUS | Status: DC | PRN
Start: 2014-07-05 — End: 2014-07-07

## 2014-07-05 MED ORDER — FENTANYL 2.5 MCG/ML BUPIVACAINE 1/10 % EPIDURAL INFUSION (WH - ANES)
INTRAMUSCULAR | Status: DC | PRN
  Administered 2014-07-05: 14 mL/h via EPIDURAL

## 2014-07-05 MED ORDER — LACTATED RINGERS IV SOLN: 500.0000 mL | Freq: Once | INTRAVENOUS | Status: DC

## 2014-07-05 NOTE — Progress Notes (Signed)
Encouraged pt to place pulse oximeter back on finger after she has finished feeding baby. Pt also requested to take BP cuff off while feeding.  BP cuff removed at pt request after obtaining reading.

## 2014-07-05 NOTE — Lactation Note (Signed)
This note was copied from the chart of Angela Mercado, [place] and Companymber Alper. Lactation Consultation Note  Initial visit made.  Breastfeeding consultation services and support information given to patient.  Mom has questions and concerns about how to know baby is getting enough milk and desire to pump so she can see what baby receives.  Baby just finished a good feeding.  Discussed we will be watching output and weights to make sure baby is obtaining adequate nutrition.  Encouraged mom to continue putting baby to breast with cues for the best milk supply.  Demonstrated breast massage/compression to increase flow and intake.  Encouraged to call with any concern or latch assist.  Patient Name: Angela Antony Maduramber Totherow Today's Date: 07/05/2014 Reason for consult: Initial assessment;Breast surgery (BREAST IMPLANTS)   Maternal Data Formula Feeding for Exclusion: Yes Reason for exclusion: Previous breast surgery (mastectomy, reduction, or augmentation where mother is unable to produce breast milk);Admission to Intensive Care Unit (ICU) post-partum Does the patient have breastfeeding experience prior to this delivery?: Yes  Feeding Feeding Type: Breast Fed Length of feed: 20 min  LATCH Score/Interventions Latch: Grasps breast easily, tongue down, lips flanged, rhythmical sucking.  Audible Swallowing: A few with stimulation Intervention(s): Skin to skin  Type of Nipple: Everted at rest and after stimulation  Comfort (Breast/Nipple): Soft / non-tender     Hold (Positioning): No assistance needed to correctly position infant at breast.  LATCH Score: 9  Lactation Tools Discussed/Used     Consult Status Consult Status: Follow-up Date: 07/06/14 Follow-up type: In-patient    Huston FoleyMOULDEN, Mekai Wilkinson S 07/05/2014, 12:10 PM

## 2014-07-05 NOTE — Progress Notes (Signed)
.   Addendum Filed Vitals:   07/05/14 0055 07/05/14 0101 07/05/14 0131 07/05/14 0201  BP: 124/86 117/83 113/84 118/82  Pulse: 77 74 77 83  Temp:      TempSrc:      Resp: 19 19 18 18   Height:      Weight:       Cath clean catch urine to confirm PCR level

## 2014-07-05 NOTE — Progress Notes (Signed)
Labor Progress  Subjective: Uncomfortable with each ctx.  Asking for IV pain relief often.  Reviewed an epidural for pain managment  Objective: BP 122/68  Pulse 91  Temp(Src) 98 F (36.7 C) (Oral)  Resp 18  Ht 5' 2.75" (1.594 m)  Wt 62.143 kg (137 lb)  BMI 24.46 kg/m2  SpO2 99%  LMP 09/20/2013   Total I/O In: 1538.2 [P.O.:60; I.V.:1228.2; IV Piggyback:250] Out: 200 [Urine:200] FHT: 120 moderate variability, + accel, no decel CTX:  regular, every 3 minutes Uterus gravid, soft non tender SVE:  Dilation: 6.5 Effacement (%): 70 Station: -2 Exam by:: V. Zhuri Krass, CNM Pitocin at 2916mUn/min  Assessment:  IUP at 41.1 weeks NICHD: Category 1 Membranes:  intact Labor progress:active, progressing appropriately  Pitocin Augmentation GBS: positive  Plan: Continue labor plan Continuous monitoring Frequent position changes to facilitate fetal rotation and descent. Continue pitocin per protocol Epidural per pt request    Romell Wolden, CNM, MSN 07/05/2014. 6:00 AM

## 2014-07-05 NOTE — Progress Notes (Signed)
V. Standard, CNM in to discuss starting of Magnesium gtt. PCR results back.

## 2014-07-05 NOTE — Progress Notes (Signed)
  Subjective: Just got epidural--now comfortable.  Has had some hypotension, treated with IV med.  Objective: BP 96/64  Pulse 91  Temp(Src) 98 F (36.7 C) (Oral)  Resp 18  Ht 5' 2.75" (1.594 m)  Wt 137 lb (62.143 kg)  BMI 24.46 kg/m2  SpO2 99%  LMP 09/20/2013 I/O last 3 completed shifts: In: 2054.2 [P.O.:180; I.V.:1524.2; IV Piggyback:350] Out: 450 [Urine:450]   Filed Vitals:   07/05/14 0755 07/05/14 0757 07/05/14 0800 07/05/14 0801  BP:  97/64  96/64  Pulse: 94 91 89 91  Temp:      TempSrc:      Resp:      Height:      Weight:      SpO2: 98%  99% 99%     FHT: Category 1 UC:   regular, every 2-3 minutes SVE:   Dilation: Lip/rim Effacement (%): 70 Station: -2 Exam by:: V. Lanthum CNM BBOW--AROM, clear fluid Pitocin at 16 mu/min  Assessment:  Progressive labor  Plan: Continue to observe for increased pressure. Discussed if patient continue to desire BTL--consent signed 02/22/14.  Patient questioned if reversible.  I advised her to defer tubal if she has any question regarding desire for permanent sterilization   Emad Brechtel CNM 07/05/2014, 8:08 AM

## 2014-07-05 NOTE — Anesthesia Preprocedure Evaluation (Signed)
Anesthesia Evaluation  Patient identified by MRN, date of birth, ID band Patient awake    Reviewed: Allergy & Precautions, H&P , Patient's Chart, lab work & pertinent test results, reviewed documented beta blocker date and time   Airway Mallampati: I TM Distance: >3 FB Neck ROM: full    Dental no notable dental hx. (+) Teeth Intact   Pulmonary former smoker,  breath sounds clear to auscultation  Pulmonary exam normal       Cardiovascular negative cardio ROS      Neuro/Psych negative neurological ROS  negative psych ROS   GI/Hepatic negative GI ROS, Neg liver ROS,   Endo/Other  negative endocrine ROS  Renal/GU      Musculoskeletal   Abdominal Normal abdominal exam  (+)   Peds  Hematology negative hematology ROS (+)   Anesthesia Other Findings   Reproductive/Obstetrics (+) Pregnancy                           Anesthesia Physical Anesthesia Plan  ASA: II  Anesthesia Plan: Epidural   Post-op Pain Management:    Induction:   Airway Management Planned:   Additional Equipment:   Intra-op Plan:   Post-operative Plan:   Informed Consent: I have reviewed the patients History and Physical, chart, labs and discussed the procedure including the risks, benefits and alternatives for the proposed anesthesia with the patient or authorized representative who has indicated his/her understanding and acceptance.     Plan Discussed with:   Anesthesia Plan Comments:         Anesthesia Quick Evaluation

## 2014-07-05 NOTE — Progress Notes (Addendum)
Labor Progress  Subjective: Talking thru each ctx but rates them at a 6, requesting pain medication.  Pt question the effectiveness of Nubain and request to alternate between Nubain and Stadol.  Pt given the option to switch to Stadol but no to alternate between the two.  Pt understand that she will not be able to have IV pain medication in transition d/t the potential respiratory depression on the infant.  Pt will consider an epidural if the pain gets unbearable.  Pt states she requested to pain mediation so she could go to sleep.    Objective: BP 128/86  Pulse 78  Temp(Src) 98.5 F (36.9 C) (Oral)  Resp 18  Ht 5' 2.75" (1.594 m)  Wt 62.143 kg (137 lb)  BMI 24.46 kg/m2  LMP 09/20/2013    Filed Vitals:   07/05/14 0131 07/05/14 0201 07/05/14 0231 07/05/14 0301  BP: 113/84 118/82 113/80 128/86  Pulse: 77 83 97 78  Temp:      TempSrc:      Resp: 18 18 18 18   Height:      Weight:          Results for orders placed during the hospital encounter of 07/04/14 (from the past 24 hour(s))  CBC     Status: None   Collection Time    07/04/14  7:55 PM      Result Value Ref Range   WBC 9.8  4.0 - 10.5 K/uL   RBC 4.04  3.87 - 5.11 MIL/uL   Hemoglobin 12.4  12.0 - 15.0 g/dL   HCT 22.936.4  79.836.0 - 92.146.0 %   MCV 90.1  78.0 - 100.0 fL   MCH 30.7  26.0 - 34.0 pg   MCHC 34.1  30.0 - 36.0 g/dL   RDW 19.413.4  17.411.5 - 08.115.5 %   Platelets 229  150 - 400 K/uL  RPR     Status: None   Collection Time    07/04/14  7:55 PM      Result Value Ref Range   RPR NON REAC  NON REAC  HIV ANTIBODY (ROUTINE TESTING)     Status: None   Collection Time    07/04/14  7:55 PM      Result Value Ref Range   HIV 1&2 Ab, 4th Generation NONREACTIVE  NONREACTIVE  URIC ACID     Status: None   Collection Time    07/04/14  9:45 PM      Result Value Ref Range   Uric Acid, Serum 3.7  2.4 - 7.0 mg/dL  COMPREHENSIVE METABOLIC PANEL     Status: Abnormal   Collection Time    07/04/14  9:45 PM      Result Value Ref Range   Sodium 137  137 - 147 mEq/L   Potassium 4.1  3.7 - 5.3 mEq/L   Chloride 101  96 - 112 mEq/L   CO2 23  19 - 32 mEq/L   Glucose, Bld 82  70 - 99 mg/dL   BUN 6  6 - 23 mg/dL   Creatinine, Ser 4.480.68  0.50 - 1.10 mg/dL   Calcium 9.3  8.4 - 18.510.5 mg/dL   Total Protein 6.9  6.0 - 8.3 g/dL   Albumin 3.0 (*) 3.5 - 5.2 g/dL   AST 14  0 - 37 U/L   ALT 8  0 - 35 U/L   Alkaline Phosphatase 262 (*) 39 - 117 U/L   Total Bilirubin <0.2 (*) 0.3 - 1.2 mg/dL  GFR calc non Af Amer >90  >90 mL/min   GFR calc Af Amer >90  >90 mL/min   Anion gap 13  5 - 15  LACTATE DEHYDROGENASE     Status: None   Collection Time    07/04/14  9:45 PM      Result Value Ref Range   LDH 152  94 - 250 U/L  TYPE AND SCREEN     Status: None   Collection Time    07/04/14  9:45 PM      Result Value Ref Range   ABO/RH(D) O POS     Antibody Screen NEG     Sample Expiration 07/07/2014    PROTEIN / CREATININE RATIO, URINE     Status: Abnormal   Collection Time    07/05/14  1:00 AM      Result Value Ref Range   Creatinine, Urine 37.81     Total Protein, Urine 55     Protein Creatinine Ratio 1.45 (*) 0.00 - 0.15  PROTEIN / CREATININE RATIO, URINE     Status: Abnormal   Collection Time    07/05/14  2:05 AM      Result Value Ref Range   Creatinine, Urine 132.25     Total Protein, Urine 72.2     Protein Creatinine Ratio 0.55 (*) 0.00 - 0.15    FHT: 125, + accel, moderate variability, no decel CTX:  regular, every 3 minutes Uterus gravid, soft non tender SVE:  Dilation: 4 Effacement (%): 60 Station: -2 Exam by:: V. Kayvan Hoefling CNM Pitocin at 64mUn/min  Assessment:  IUP at 41.1 weeks NICHD: Category 1 Membranes: intact Labor progress: active Pitocin Augmentation since 0020 GBS: positive Preeclamptic   Plan: Continue labor plan Continuous monitoring Rest/Ambulate Frequent position changes to facilitate fetal rotation and descent. Will reassess with cervical exam at 0600 or earlier if necessary Continue pitocin  per protocol Start magneisum 4g bolus, 2g continous   Kaiven Vester, CNM, MSN 07/05/2014. 3:08 AM

## 2014-07-05 NOTE — Lactation Note (Signed)
This note was copied from the chart of Boy Becton, Dickinson and Companymber [place]. Lactation Consultation Note  Patient Name: Boy Antony Maduramber Weiss ATFTD'DToday's Date: 07/05/2014 Reason for consult: Other (Comment) (RN request for comfort gelpads; later asked for lanolin) LC sent comfort gelpads to AICU at nurse's request and later, LC was called because mom asked for lanolin but had not used her comfort gelpads.  LC explained to RN the reason that lanolin is not recommended and asked RN to encourage mom to try comfort gelpads tonight.   Maternal Data    Feeding Feeding Type: Breast Fed Length of feed: 20 min  LATCH Score/Interventions Latch: Grasps breast easily, tongue down, lips flanged, rhythmical sucking.  Audible Swallowing: Spontaneous and intermittent Intervention(s): Skin to skin  Type of Nipple: Everted at rest and after stimulation  Comfort (Breast/Nipple): Filling, red/small blisters or bruises, mild/mod discomfort  Problem noted: Mild/Moderate discomfort Interventions (Mild/moderate discomfort): Comfort gels  Hold (Positioning): No assistance needed to correctly position infant at breast.  LATCH Score: 10 (per RN assessment)  Lactation Tools Discussed/Used   Comfort gelpads (RN to provide and instruct mother in their use); RN to caution mom that [facility] and not recommended  Consult Status Consult Status: Follow-up Date: 07/06/14 Follow-up type: In-patient    Warrick ParisianBryant, Kyle Stansell [facility] 07/05/2014, 11:26 PM

## 2014-07-05 NOTE — Anesthesia Procedure Notes (Signed)
Epidural Patient location during procedure: OB Start time: 07/05/2014 7:14 AM End time: 07/05/2014 7:18 AM  Staffing Anesthesiologist: Leilani AbleHATCHETT, Blaze Sandin  Preanesthetic Checklist Completed: patient identified, surgical consent, pre-op evaluation, timeout performed, IV checked, risks and benefits discussed and monitors and equipment checked  Epidural Patient position: sitting Prep: site prepped and draped and DuraPrep Patient monitoring: continuous pulse ox and blood pressure Approach: midline Injection technique: LOR air  Needle:  Needle type: Tuohy  Needle gauge: 17 G Needle length: 9 cm and 9 Needle insertion depth: 5 cm cm Catheter type: closed end flexible Catheter size: 19 Gauge Catheter at skin depth: 10 cm Test dose: negative and Other  Assessment Sensory level: T9 Events: blood not aspirated, injection not painful, no injection resistance, negative IV test and no paresthesia  Additional Notes Reason for block:procedure for pain

## 2014-07-05 NOTE — Progress Notes (Addendum)
Labor Progress  Subjective: Comfortable, not feeling ctx.  Pain management options reviewed  Objective: BP 117/68  Pulse 76  Temp(Src) 98.5 F (36.9 C) (Oral)  Resp 18  Ht 5' 2.75" (1.594 m)  Wt 62.143 kg (137 lb)  BMI 24.46 kg/m2  LMP 09/20/2013     FHT: 120, + accel, no decels, moderate variabililty CTX:  regular, every 3 minutes Uterus gravid, soft non tender SVE:  Dilation: 3.5 Effacement (%): 60 Station: -2 Exam by:: V. Jamond Neels, CNM   Assessment:  IUP at 41.1 weeks NICHD: Category 1 Membranes:  intact Labor progress: early GBS: positive  Plan: Continue labor plan Continuous monitoring Frequent position changes to facilitate fetal rotation and descent. Will reassess with cervical exam at 0400 or earlier if necessary Start pitocin per orders and protocol Start ABX   Philisha Weinel, CNM, MSN 07/05/2014. 12:17 AM

## 2014-07-05 NOTE — Progress Notes (Signed)
UR chart review completed.  

## 2014-07-06 LAB — CBC
HCT: 31.1 % — ABNORMAL LOW (ref 36.0–46.0)
HEMOGLOBIN: 10.3 g/dL — AB (ref 12.0–15.0)
MCH: 30.1 pg (ref 26.0–34.0)
MCHC: 33.1 g/dL (ref 30.0–36.0)
MCV: 90.9 fL (ref 78.0–100.0)
Platelets: 191 10*3/uL (ref 150–400)
RBC: 3.42 MIL/uL — ABNORMAL LOW (ref 3.87–5.11)
RDW: 13.5 % (ref 11.5–15.5)
WBC: 8.9 10*3/uL (ref 4.0–10.5)

## 2014-07-06 NOTE — Progress Notes (Signed)
This note also relates to the following rows which could not be included: SpO2 - Cannot attach notes to unvalidated device data   Pt will not leave pulse ox or BP cuff on continuously. Pt is aware it is unit policy, but does not want to comply. Pt is sleeping.

## 2014-07-06 NOTE — Progress Notes (Signed)
Subjective: Postpartum Day 1: Vaginal delivery, no laceration Patient up ad lib, reports no syncope or dizziness. Feeding:  Breast Contraceptive plan:  Considering tubal later Denies HA, visual sx, epigastric pain.  Objective: Vital signs in last 24 hours: Temp:  [97.5 F (36.4 C)-98.8 F (37.1 C)] 98 F (36.7 C) (10/02 0700) Pulse Rate:  [63-113] 88 (10/02 0815) Resp:  [16-20] 18 (10/02 0815) BP: (87-128)/(47-90) 100/60 mmHg (10/02 0815) SpO2:  [94 %-100 %] 99 % (10/02 0815) Weight:  [130 lb 7 oz (59.166 kg)] 130 lb 7 oz (59.166 kg) (10/01 1039)   Filed Vitals:   07/06/14 0615 07/06/14 0700 07/06/14 0720 07/06/14 0815  BP:  105/75 102/64 100/60  Pulse: 86 87 88 88  Temp:  98 F (36.7 C)    TempSrc:  Oral    Resp:  18  18  Height:      Weight:      SpO2: 98% 99% 98% 99%   24 hour I/O balance = +184. Urine output 6625 cc since delivery.  Magnesium d/c'd at 0818.  Physical Exam:  General: alert Lochia: appropriate Uterine Fundus: firm Perineum: Perineum intact DVT Evaluation: No evidence of DVT seen on physical exam. Negative Homan's sign.    Recent Labs  07/05/14 0918 07/06/14 0537  HGB 11.5* 10.3*  HCT 34.6* 31.1*    Assessment/Plan: Status post vaginal delivery day 1 Pre-eclampsia--magnesium x 24 hour pp, now d/c'd.. Stable Plan observation x 4 hours post-magnesium, then transfer to PP unit. Plan for discharge tomorrow    Nyra CapesLATHAM, VICKICNM 07/06/2014, 8:41 AM

## 2014-07-06 NOTE — Anesthesia Postprocedure Evaluation (Signed)
Anesthesia Post Note  Patient: [name]  Procedure(s) Performed: * No procedures listed *  Anesthesia type: Epidural  Patient location: Mother/Baby  Post pain: Pain level controlled  Post assessment: Post-op Vital signs reviewed  Last Vitals:  Filed Vitals:   07/06/14 0700  BP: 105/75  Pulse: 87  Temp: 36.7 C  Resp: 18    Post vital signs: Reviewed  Level of consciousness:alert  Complications: No apparent anesthesia complications

## 2014-07-07 MED ORDER — OXYCODONE-ACETAMINOPHEN 5-325 MG PO TABS: 2.0000 | ORAL_TABLET | ORAL | Status: DC | PRN

## 2014-07-07 MED ORDER — IBUPROFEN 600 MG PO TABS: 600.0000 mg | ORAL_TABLET | Freq: Four times a day (QID) | ORAL | Status: DC | PRN

## 2014-07-07 MED ORDER — MEASLES, MUMPS & RUBELLA VAC ~~LOC~~ INJ
0.5000 mL | INJECTION | Freq: Once | SUBCUTANEOUS | Status: AC
  Administered 2014-07-07: 0.5 mL via SUBCUTANEOUS
  Filled 2014-07-07: qty 0.5

## 2014-07-07 NOTE — Discharge Instructions (Signed)

## 2014-07-07 NOTE — Discharge Summary (Signed)
Vaginal Delivery Discharge Summary  Angela Mercado  DOB:    09/21/1986 MRN:    [id number] CSN:    [id number]  Date of admission:                  07/04/14  Date of discharge:                   07/07/14  Procedures this admission:   SVB  Date of Delivery: 07/05/14  Newborn Data:  Live born female  Birth Weight: 6 lb 11.1 oz (3036 g) APGAR: 8, 9  Home with mother. Name: Vicente Serene Circumcision Plan: Outpatient  History of Present Illness:  Ms. Angela Mercado is a 28 y.o. female, [phone], who presents at [redacted]w[redacted]d weeks gestation. The patient has been followed at the [facility] division of [facility]. She was admitted induction of labor for postdates. Her pregnancy has been complicated by:  Patient Active Problem List   Diagnosis Date Noted  . Positive GBS test 07/05/2014  . Hx of pre-eclampsia in prior pregnancy, currently pregnant 07/05/2014  . Hx of breast augmentation 2010 07/05/2014  . Vaginal delivery 07/05/2014  . Smoker 07/04/2014  . Rubella non-immune status, antepartum 07/04/2014  . Frequent UTI 05/12/2013     Hospital Course:  Admitted 07/04/14 for induction due to postdates. Positive GBS. Progressed with foley bulb and pitocin.  Utilized epidural for pain management.  Delivery was performed by Nigel Bridgeman, CNM, without complication. Patient and baby tolerated the procedure without difficulty, with no laceration noted. Infant status was stable and remained in room with mother.  Mother and infant then had an uncomplicated postpartum course, with breast feeding going well. Mom's physical exam was WNL, and she was discharged home in stable condition. Contraception plan was initially BTL, but then patient elected to defer it during her hospitalization.  At the time of d/c, she was still considering the question--she declined other contraception at d/c.  She received adequate benefit from po pain medications, using Motrin and Percocet  with benefit.  Patient was offered rubella vaccine prior to d/c--decision unknown at the time of this note.   Feeding:  breast  Contraception:  bilateral tubal ligation eventually, undecided at present.  Discharge hemoglobin:  Hemoglobin  Date Value Ref Range Status  07/06/2014 10.3* 12.0 - 15.0 g/dL Final     HCT  Date Value Ref Range Status  07/06/2014 31.1* 36.0 - 46.0 % Final    Discharge Physical Exam:   General: alert Lochia: appropriate Uterine Fundus: firm Incision: Perineum clear DVT Evaluation: No evidence of DVT seen on physical exam. Negative Homan's sign.  Intrapartum Procedures: spontaneous vaginal delivery Postpartum Procedures: none  Rubella vaccine offered prior to d/c, unsure if patient received. Complications-Operative and Postpartum: none  Discharge Diagnoses: Term Pregnancy-delivered, postdates induction, GBS positive  Discharge Information:  Routine  Activity:           pelvic rest Diet:                routine Medications: Ibuprofen and Percocet Condition:      improved Instructions:     Discharge to: home  Follow-up Information   Follow up with [facility]. Schedule an appointment as soon as possible for a visit in 6 weeks. (Call to schedule circumcision.  Call with any questions.)    Specialty:  Obstetrics and Gynecology   Contact information:   [address] [place] [address] [phone]  Nigel BridgemanLATHAM, Clairessa Boulet CNM 07/07/2014 8:03 AM

## 2014-07-07 NOTE — Lactation Note (Signed)
This note was copied from the chart of Angela Becton, Dickinson and Companymber [place]. Lactation Consultation Note       Follow up consult with this mom of a term baby, now 7150 hours old. The baby has been latching well, but mom has sore nipples with some cracking. 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 With finger sucking, he can get his tongue just over the bottom gum, but his tongue is pointed and stetched with  extension. iIexplained to mom and dad, and the  Faculty MD, Dr. Joseph ArtWoods, what I saw, and mom and dad are aware to point this out [facility] Mom rented a 2 week DEP, [facility] Mom is returning to school in a few days.   Patient Name: Angela Mercado [id number]'s Date: 07/07/2014 Reason for consult: Follow-up assessment   Maternal Data    Feeding Feeding Type: Breast Fed Length of feed: 15 min  LATCH Score/Interventions Latch: Grasps breast easily, tongue down, lips flanged, rhythmical sucking.  Audible Swallowing: A few with stimulation Intervention(s): Hand expression  Type of Nipple: Everted at rest and after stimulation  Comfort (Breast/Nipple): Filling, red/small blisters or bruises, mild/mod discomfort  Problem noted: Mild/Moderate discomfort;Cracked, bleeding, blisters, bruises Interventions  (Cracked/bleeding/bruising/blister): Expressed breast milk to nipple;Double electric pump Interventions (Mild/moderate discomfort): Comfort gels  Hold (Positioning): Assistance needed to correctly position infant at breast and maintain latch. Intervention(s): Breastfeeding basics reviewed;Support Pillows;Position options;Skin to skin  LATCH Score: 7  Lactation Tools Discussed/Used WIC Program: No ([facility] to apply and get DEP for baby with tongue tie, and mom going back to scholl on 10/6) Pump Review: Setup, frequency, and cleaning;Milk Storage (mom instructerd in  pump use and care)   Consult Status Consult Status: Complete Follow-up type: Call as needed    Alfred LevinsLee, Lotoya Casella Anne 07/07/2014, 11:07 AM

## 2014-08-06 ENCOUNTER — Encounter (HOSPITAL_COMMUNITY): Payer: Self-pay

## 2015-01-23 IMAGING — US US OB COMP LESS 14 WK
1 series · 14 of 28 positions shown · non-contrast
Comparison: None.

CLINICAL DATA: Early pregnancy.  Unsure of LMP.  Bleeding.

OBSTETRIC <14 WK ULTRASOUND
TECHNIQUE: Transabdominal ultrasound was performed for evaluation
of the gestation as well as the maternal uterus and adnexal
regions.

[Series 1: us ob comp less 14 wks · 38 acquisitions, 14 frames shown]
[im 2/38]
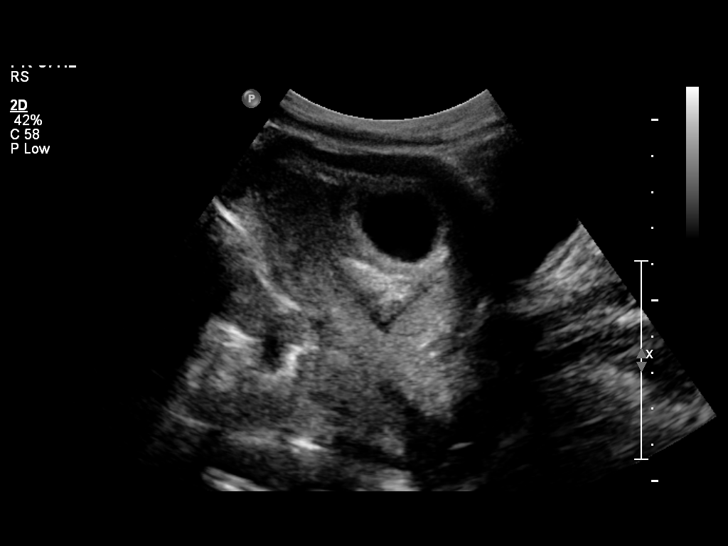
[im 5/38]
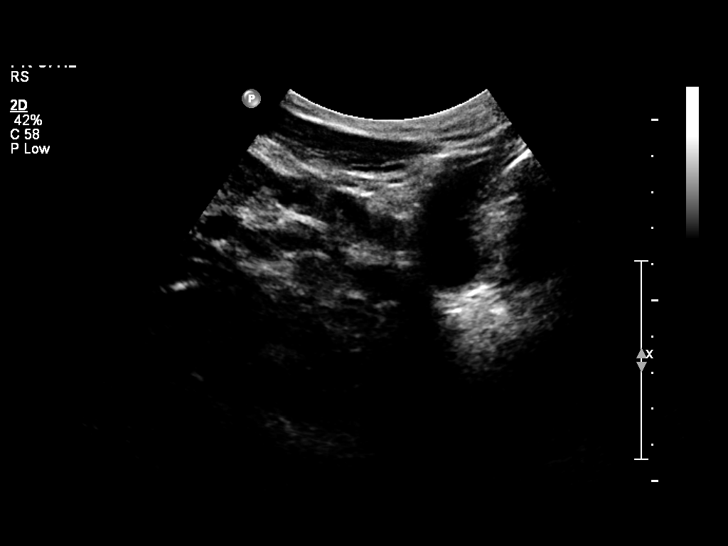
[im 7/38]
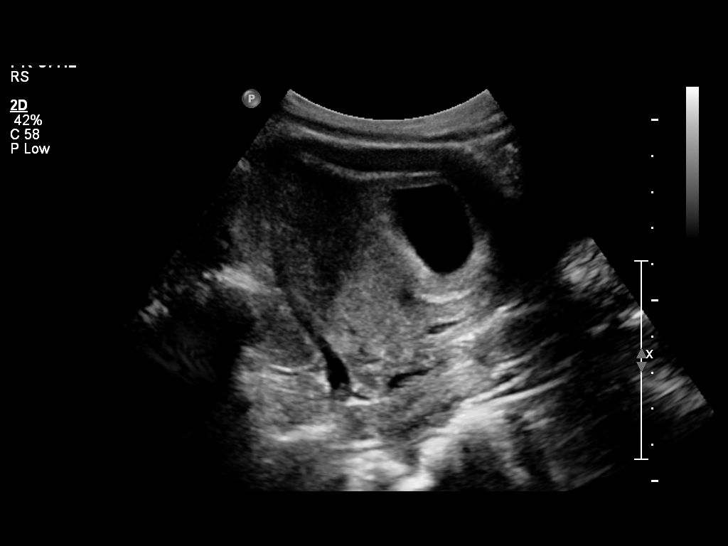
[im 10/38]
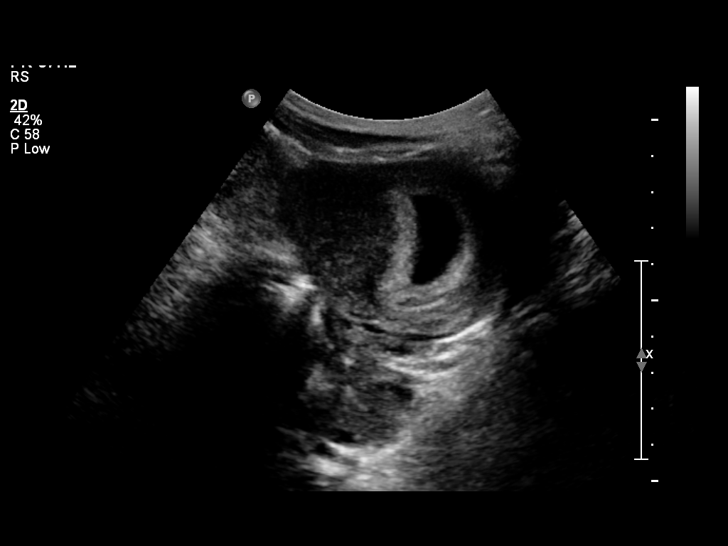
[im 13/38]
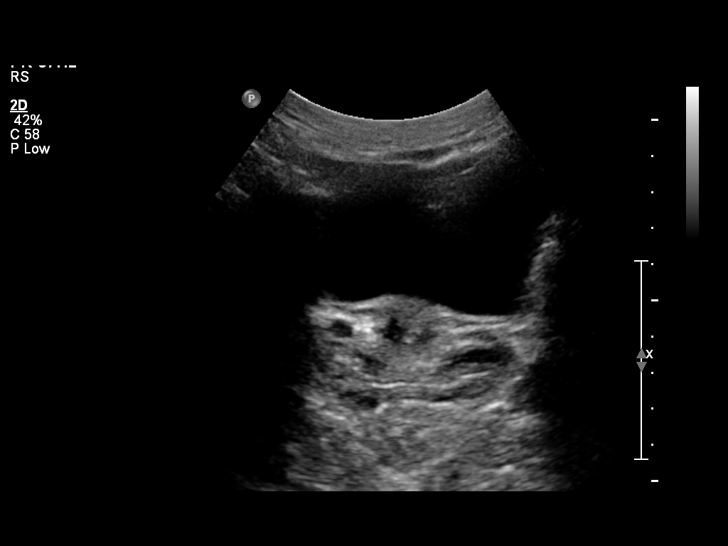
[im 16/38]
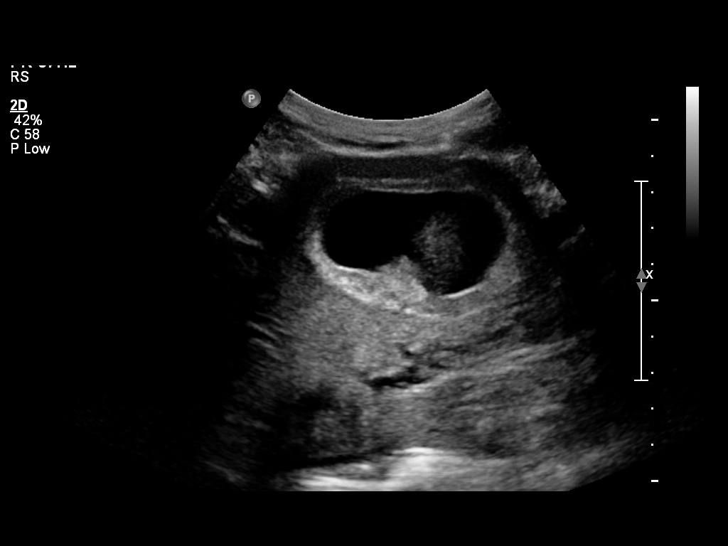
[im 18/38]
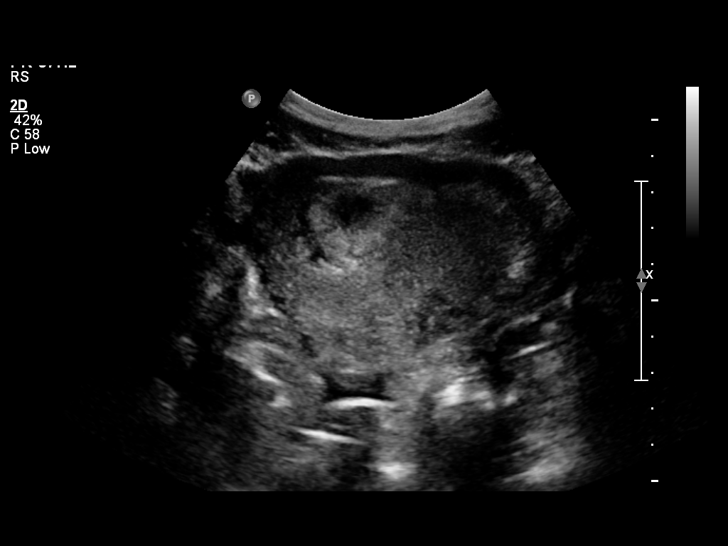
[im 21/38]
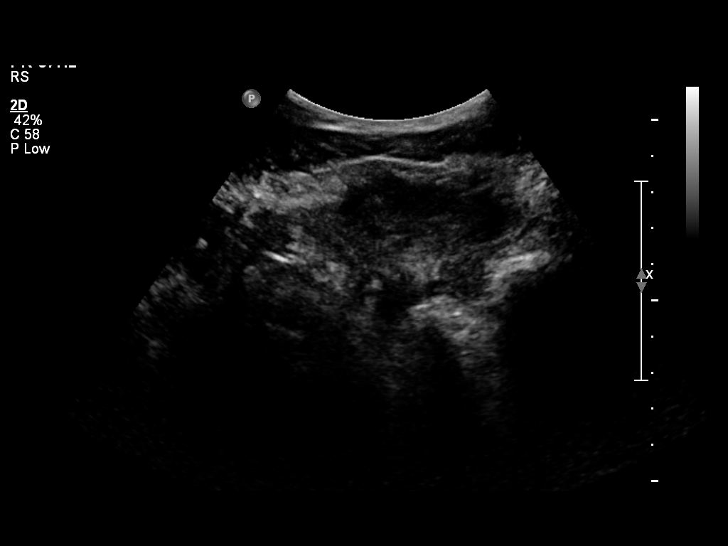
[im 24/38]
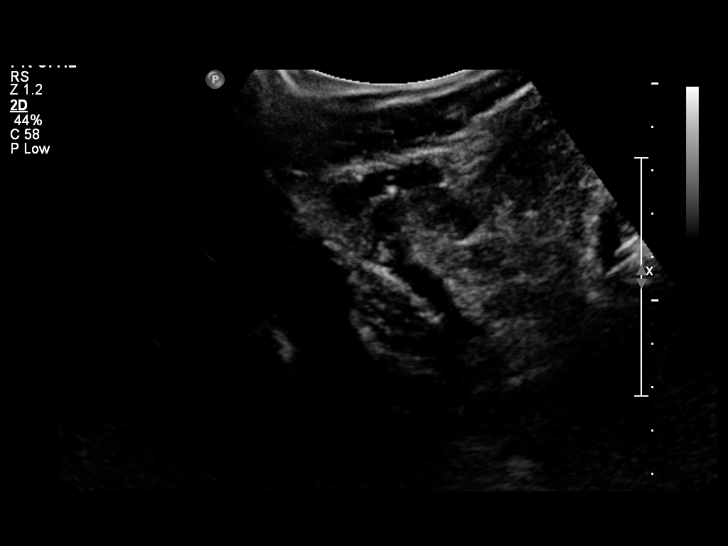
[im 27/38]
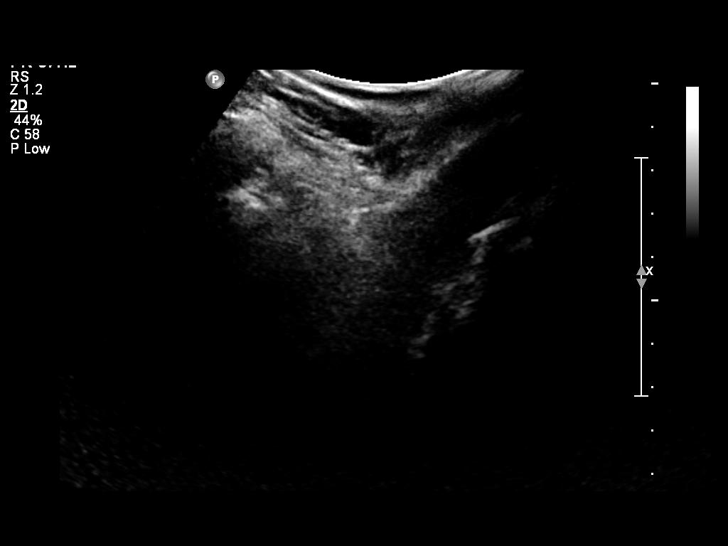
[im 29/38]
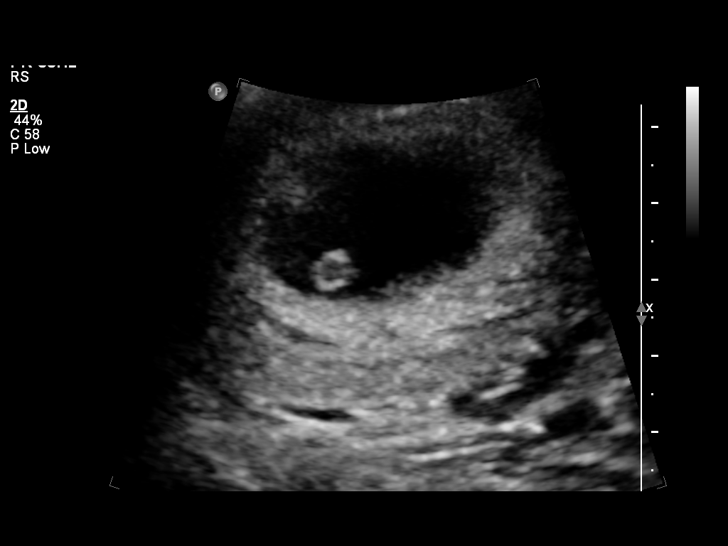
[im 32/38]
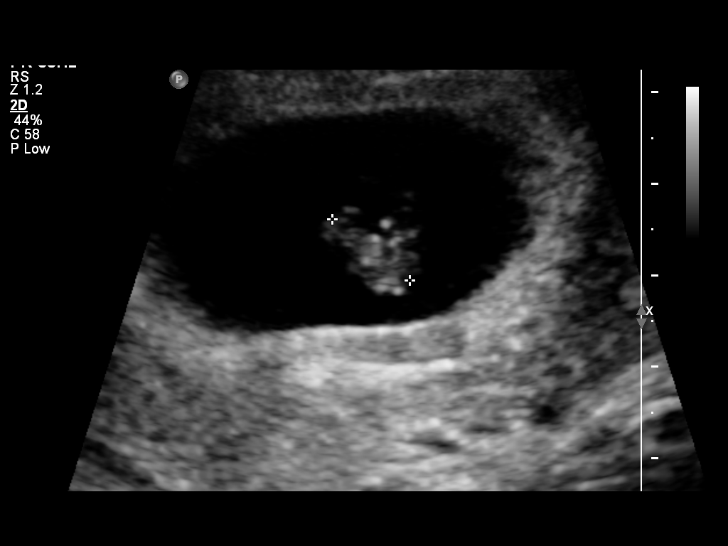
[im 35/38]
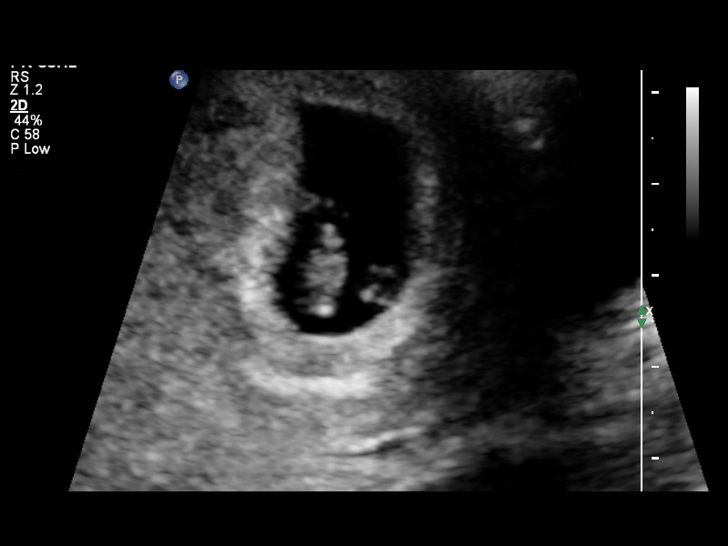
[im 38/38]
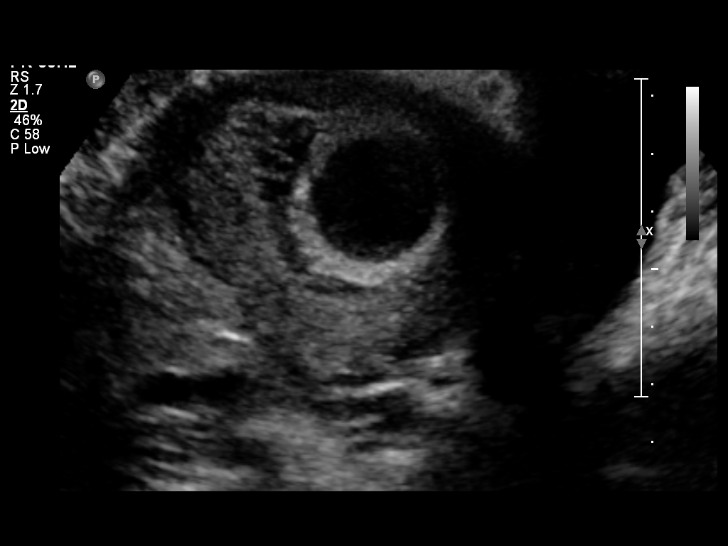

[14 of 28 positions shown; findings below may reference images not displayed]

Intrauterine gestational sac: Visualized
Yolk sac: Visualized
Embryo: Visualized
Cardiac Activity: Visualized
Heart Rate: 133 bpm

CRL:  10.3mm  7 w  1 d        US EDC: 02/08/2014

Maternal uterus/Adnexae:
A small subchorionic hemorrhage is present. Both maternal ovaries
have normal appearances.  No free pelvic fluid is appreciated.
IMPRESSION: 1.  Single living intrauterine pregnancy with estimated gestational
age by crown-rump length of 7 weeks 1 day with an EDC of
02/08/2014.
2.  Small subchorionic hemorrhage.

Ultrasound for fetal anatomic evaluation at 18 to 20 weeks
gestational age is recommended.

## 2018-10-30 ENCOUNTER — Encounter (HOSPITAL_COMMUNITY): Payer: Self-pay | Admitting: Emergency Medicine

## 2018-10-30 ENCOUNTER — Other Ambulatory Visit: Payer: Self-pay

## 2018-10-30 ENCOUNTER — Ambulatory Visit (HOSPITAL_COMMUNITY)
Admission: EM | Admit: 2018-10-30 | Discharge: 2018-10-30 | Disposition: A | Discharge status: Home / Self Care | Payer: Medicaid Other | Attending: Family Medicine | Admitting: Family Medicine

## 2018-10-30 DIAGNOSIS — N760 Acute vaginitis: Secondary | ICD-10-CM

## 2018-10-30 DIAGNOSIS — B9689 Other specified bacterial agents as the cause of diseases classified elsewhere: Secondary | ICD-10-CM | POA: Insufficient documentation

## 2018-10-30 LAB — POCT URINALYSIS DIP (DEVICE)
Bilirubin Urine: NEGATIVE
Glucose, UA: NEGATIVE mg/dL
Ketones, ur: NEGATIVE mg/dL
Nitrite: NEGATIVE
PH: 6 (ref 5.0–8.0)
Protein, ur: 30 mg/dL — AB
Specific Gravity, Urine: 1.02 (ref 1.005–1.030)
Urobilinogen, UA: 0.2 mg/dL (ref 0.0–1.0)

## 2018-10-30 MED ORDER — METRONIDAZOLE 500 MG PO TABS: 500.0000 mg | ORAL_TABLET | Freq: Two times a day (BID) | ORAL | 0 refills | Status: DC

## 2018-10-30 NOTE — ED Triage Notes (Signed)
The patient presented to the [facility] with a complaint of a vaginal discharge and some mild urinary symptoms. The patient stated that she was tested and treated for gonorrhea at the end of December 2019.

## 2018-10-30 NOTE — ED Provider Notes (Signed)
[facility]    CSN: [id number] Arrival date & time: 10/30/18  1715     History   Chief Complaint No chief complaint on file.   HPI Angela Mercado is a 33 y.o. female.   Patient is having malodorous vaginal discharge some urinary frequency but no pain.  There is no back pain fever chills nausea or vomiting.  She tells me this is like when she had bacterial vaginosis before  HPI  Past Medical History:  Diagnosis Date  . Chronic kidney disease   . History of HPV infection   . History of syphilis   . Hx of chlamydia infection   . Hx of gonorrhea     Patient Active Problem List   Diagnosis Date Noted  . Positive GBS test 07/05/2014  . Hx of pre-eclampsia in prior pregnancy, currently pregnant 07/05/2014  . Hx of breast augmentation 2010 07/05/2014  . Vaginal delivery 07/05/2014  . Smoker 07/04/2014  . Rubella non-immune status, antepartum 07/04/2014  . Frequent UTI 05/12/2013    Past Surgical History:  Procedure Laterality Date  . PLACEMENT OF BREAST IMPLANTS    . WISDOM TOOTH EXTRACTION      OB History    Gravida  4   Para  3   Term  3   Preterm      AB  1   Living  3     SAB  1   TAB      Ectopic      Multiple      Live Births  3            Home Medications    Prior to Admission medications   Medication Sig Start Date End Date Taking? Authorizing Provider  ibuprofen (ADVIL,MOTRIN) 600 MG tablet Take 1 tablet (600 mg total) by mouth every 6 (six) hours as needed. 07/07/14  Yes Nigel Bridgeman, CNM  metroNIDAZOLE (FLAGYL) 500 MG tablet Take 1 tablet (500 mg total) by mouth 2 (two) times daily. 10/30/18   Frederica Kuster, MD    Family History History reviewed. No pertinent family history.  Social History Social History   Tobacco Use  . Smoking status: Former Smoker    Last attempt to quit: 06/05/2013    Years since quitting: 5.4  Substance Use Topics  . Alcohol use: No  . Drug use: No     Allergies     Oxycodone-acetaminophen   Review of Systems Review of Systems  Genitourinary: Positive for dysuria and vaginal discharge.  All other systems reviewed and are negative.    Physical Exam Triage Vital Signs ED Triage Vitals  Enc Vitals Group     BP 10/30/18 1751 (!) 107/58     Pulse Rate 10/30/18 1751 (!) 50     Resp 10/30/18 1751 16     Temp 10/30/18 1751 98.2 F (36.8 C)     Temp Source 10/30/18 1751 Oral     SpO2 10/30/18 1751 100 %     Weight --      Height --      Head Circumference --      Peak Flow --      Pain Score 10/30/18 1749 0     Pain Loc --      Pain Edu? --      Excl. in GC? --    No data found.  Updated Vital Signs BP (!) 107/58 (BP Location: Right Arm)   Pulse (!) 50   Temp 98.2  F (36.8 C) (Oral)   Resp 16   LMP 10/05/2018 (Exact Date)   SpO2 100%   Visual Acuity Right Eye Distance:   Left Eye Distance:   Bilateral Distance:    Right Eye Near:   Left Eye Near:    Bilateral Near:     Physical Exam Constitutional:      Appearance: Normal appearance. She is normal weight.  Cardiovascular:     Rate and Rhythm: Normal rate and regular rhythm.  Pulmonary:     Effort: Pulmonary effort is normal.     Breath sounds: Normal breath sounds.  Abdominal:     General: Bowel sounds are normal.     Palpations: Abdomen is soft.  Neurological:     Mental Status: She is alert.   UA is positive for WBCs  UC Treatments / Results  Labs (all labs ordered are listed, but only abnormal results are displayed) Labs Reviewed  POCT URINALYSIS DIP (DEVICE) - Abnormal; Notable for the following components:      Result Value   Hgb urine dipstick SMALL (*)    Protein, ur 30 (*)    Leukocytes, UA LARGE (*)    All other components within normal limits  URINE CULTURE  URINE CYTOLOGY ANCILLARY ONLY    EKG None  Radiology No results found.  Procedures Procedures (including critical care time)  Medications Ordered in UC Medications - No data to  display  Initial Impression / Assessment and Plan / UC Course  I have reviewed the triage vital signs and the nursing notes.  Pertinent labs & imaging results that were available during my care of the patient were reviewed by me and considered in my medical decision making (see chart for details).     Bacterial vaginosis (by history).  Will begin metronidazole and submit urine for urine cytology as well as culture Final Clinical Impressions(s) / UC Diagnoses   Final diagnoses:  Bacterial vaginosis   Discharge Instructions   None    ED Prescriptions    Medication Sig Dispense Auth. Provider   metroNIDAZOLE (FLAGYL) 500 MG tablet Take 1 tablet (500 mg total) by mouth 2 (two) times daily. 14 tablet Frederica Kuster, MD     Controlled Substance Prescriptions [place] Controlled Substance Registry consulted? No   Frederica Kuster, MD 10/30/18 1816

## 2018-10-31 LAB — URINE CYTOLOGY ANCILLARY ONLY
Chlamydia: NEGATIVE
Neisseria Gonorrhea: NEGATIVE
TRICH (WINDOWPATH): POSITIVE — AB

## 2018-11-01 LAB — URINE CULTURE

## 2018-11-02 ENCOUNTER — Telehealth (HOSPITAL_COMMUNITY): Payer: Self-pay | Admitting: Emergency Medicine

## 2018-11-02 MED ORDER — SULFAMETHOXAZOLE-TRIMETHOPRIM 800-160 MG PO TABS: 1.0000 | ORAL_TABLET | Freq: Two times a day (BID) | ORAL | 0 refills | Status: AC

## 2018-11-02 NOTE — Telephone Encounter (Signed)
Urine culture was positive for enterobacter aerogenes. Prescription for bactrim per protocol sent to pharmacy of choice.   Trichomonas is positive. Rx metronidazole was given at the urgent care visit. Pt needs education to please refrain from sexual intercourse for 7 days to give the medicine time to work. Sexual partners need to be notified and tested/treated. Condoms may reduce risk of reinfection. Recheck for further evaluation if symptoms are not improving.   Attempted to reach patient. No answer at this time. Busy signal.

## 2018-11-04 ENCOUNTER — Telehealth (HOSPITAL_COMMUNITY): Payer: Self-pay | Admitting: Emergency Medicine

## 2018-11-04 NOTE — Telephone Encounter (Signed)
Attempted to reach patient x2. No answer at this time. Busy signal  

## 2018-11-07 ENCOUNTER — Telehealth (HOSPITAL_COMMUNITY): Payer: Self-pay | Admitting: Emergency Medicine

## 2018-11-07 NOTE — Telephone Encounter (Signed)
Attempted call x 3 with busy signal; letter sent for positive urine culture

## 2018-11-10 ENCOUNTER — Telehealth (HOSPITAL_COMMUNITY): Payer: Self-pay | Admitting: Emergency Medicine

## 2018-11-10 MED ORDER — SULFAMETHOXAZOLE-TRIMETHOPRIM 800-160 MG PO TABS: 1.0000 | ORAL_TABLET | Freq: Two times a day (BID) | ORAL | 0 refills | Status: AC

## 2018-11-10 MED ORDER — METRONIDAZOLE 500 MG PO TABS: 2000.0000 mg | ORAL_TABLET | Freq: Once | ORAL | 0 refills | Status: AC

## 2018-11-10 NOTE — Telephone Encounter (Signed)
Patient called back with test results, will pick up prescription.

## 2018-11-10 NOTE — Telephone Encounter (Signed)
Pt states she did not realize she had trich and had sexual intercourse with partner, will resend medicine for her, partner will need to be tested.

## 2019-01-11 ENCOUNTER — Other Ambulatory Visit: Payer: Self-pay

## 2019-01-11 ENCOUNTER — Ambulatory Visit (HOSPITAL_COMMUNITY)
Admission: EM | Admit: 2019-01-11 | Discharge: 2019-01-11 | Disposition: A | Discharge status: Home / Self Care | Payer: Medicaid Other | Attending: Family Medicine | Admitting: Family Medicine

## 2019-01-11 ENCOUNTER — Encounter (HOSPITAL_COMMUNITY): Payer: Self-pay

## 2019-01-11 DIAGNOSIS — Z8619 Personal history of other infectious and parasitic diseases: Secondary | ICD-10-CM | POA: Diagnosis not present

## 2019-01-11 DIAGNOSIS — N898 Other specified noninflammatory disorders of vagina: Secondary | ICD-10-CM | POA: Diagnosis present

## 2019-01-11 DIAGNOSIS — Z202 Contact with and (suspected) exposure to infections with a predominantly sexual mode of transmission: Secondary | ICD-10-CM | POA: Insufficient documentation

## 2019-01-11 LAB — POCT URINALYSIS DIP (DEVICE)
Bilirubin Urine: NEGATIVE
Glucose, UA: NEGATIVE mg/dL
Hgb urine dipstick: NEGATIVE
Ketones, ur: NEGATIVE mg/dL
Leukocytes,Ua: NEGATIVE
Nitrite: NEGATIVE
Protein, ur: NEGATIVE mg/dL
Specific Gravity, Urine: >=1.03 (ref 1.005–1.030)
Urobilinogen, UA: 0.2 mg/dL (ref 0.0–1.0)
pH: 6.5 (ref 5.0–8.0)

## 2019-01-11 LAB — POCT PREGNANCY, URINE: Preg Test, Ur: NEGATIVE

## 2019-01-11 MED ORDER — METRONIDAZOLE 0.75 % VA GEL: 1.0000 | Freq: Every day | VAGINAL | 0 refills | Status: AC

## 2019-01-11 MED ORDER — CEFTRIAXONE SODIUM 250 MG IJ SOLR
INTRAMUSCULAR | Status: AC
  Filled 2019-01-11: qty 250

## 2019-01-11 MED ORDER — CEFTRIAXONE SODIUM 250 MG IJ SOLR
250.0000 mg | Freq: Once | INTRAMUSCULAR | Status: AC
  Administered 2019-01-11: 14:00:00 250 mg via INTRAMUSCULAR

## 2019-01-11 MED ORDER — LIDOCAINE HCL (PF) 1 % IJ SOLN
INTRAMUSCULAR | Status: AC
  Filled 2019-01-11: qty 2

## 2019-01-11 MED ORDER — AZITHROMYCIN 250 MG PO TABS: 1000.0000 mg | ORAL_TABLET | Freq: Once | ORAL | 0 refills | Status: AC

## 2019-01-11 NOTE — ED Triage Notes (Signed)
Increased vaginal discharge with odor, history of BV

## 2019-01-11 NOTE — Discharge Instructions (Signed)
We have treated you today for gonorrhea, with Rocephin.  Please take 4 tablets of azithromycin at one time once you have food on your stomach.  Please refrain from sexual intercourse for 7 days while medicines eliminating infection.   Continue to use MetroGel at nighttime for BV  We are testing you for Gonorrhea, Chlamydia, Trichomonas, Yeast and Bacterial Vaginosis. We will call you if anything is positive and let you know if you require any further treatment. Please inform partners of any positive results.   Please return if symptoms not improving with treatment, development of fever, nausea, vomiting, abdominal pain.

## 2019-01-11 NOTE — ED Provider Notes (Signed)
[facility]    CSN: [id number] Arrival date & time: 01/11/19  1306     History   Chief Complaint Chief Complaint  Patient presents with  . Vaginal Discharge    HPI Angela Mercado is a 33 y.o. female history of previous tobacco use presenting today for evaluation of vaginal discharge.  Patient states that she has had vaginal discharge for the past few days.  She is also had associated lower abdominal discomfort.  Discharge is associated with odor.  Patient states that she has history of BV and symptoms feel slightly similar to BV but she does not typically have the lower abdominal pain.  She has also had some mild nausea and vomiting over the past 2 days.  Denies fevers.  Notes that she was recently contacted by another female who has tested positive for gonorrhea and chlamydia who has had recent intercourse with a partner that she has been sexually active with.  Denies any rashes or lesions.  Last menstrual period was approximately 1 month ago, she is due to start her menstrual cycle in the next few days.  HPI  Past Medical History:  Diagnosis Date  . Chronic kidney disease   . History of HPV infection   . History of syphilis   . Hx of chlamydia infection   . Hx of gonorrhea     Patient Active Problem List   Diagnosis Date Noted  . Positive GBS test 07/05/2014  . Hx of pre-eclampsia in prior pregnancy, currently pregnant 07/05/2014  . Hx of breast augmentation 2010 07/05/2014  . Vaginal delivery 07/05/2014  . Smoker 07/04/2014  . Rubella non-immune status, antepartum 07/04/2014  . Frequent UTI 05/12/2013    Past Surgical History:  Procedure Laterality Date  . PLACEMENT OF BREAST IMPLANTS    . WISDOM TOOTH EXTRACTION      OB History    Gravida  4   Para  3   Term  3   Preterm      AB  1   Living  3     SAB  1   TAB      Ectopic      Multiple      Live Births  3            Home Medications    Prior to Admission medications    Medication Sig Start Date End Date Taking? Authorizing Provider  azithromycin (ZITHROMAX) 250 MG tablet Take 4 tablets (1,000 mg total) by mouth once for 1 dose. Take first 2 tablets together, then 1 every day until finished. 01/11/19 01/11/19  Wieters, Hallie C, PA-C  ibuprofen (ADVIL,MOTRIN) 600 MG tablet Take 1 tablet (600 mg total) by mouth every 6 (six) hours as needed. 07/07/14   Nigel Bridgeman, CNM  metroNIDAZOLE (METROGEL) 0.75 % vaginal gel Place 1 Applicatorful vaginally at bedtime for 5 days. 01/11/19 01/16/19  Wieters, Junius Creamer, PA-C    Family History Family History  Family history unknown: Yes    Social History Social History   Tobacco Use  . Smoking status: Former Smoker    Last attempt to quit: 06/05/2013    Years since quitting: 5.6  Substance Use Topics  . Alcohol use: No  . Drug use: No     Allergies   Oxycodone-acetaminophen   Review of Systems Review of Systems  Constitutional: Negative for fever.  Respiratory: Negative for shortness of breath.   Cardiovascular: Negative for chest pain.  Gastrointestinal: Positive for abdominal pain, nausea  and vomiting. Negative for diarrhea.  Genitourinary: Positive for vaginal discharge. Negative for dysuria, flank pain, genital sores, hematuria, menstrual problem, vaginal bleeding and vaginal pain.  Musculoskeletal: Negative for back pain.  Skin: Negative for rash.  Neurological: Negative for dizziness, light-headedness and headaches.     Physical Exam Triage Vital Signs ED Triage Vitals [01/11/19 1326]  Enc Vitals Group     BP 117/80     Pulse Rate 86     Resp 18     Temp (!) 97.2 F (36.2 C)     Temp src      SpO2 100 %     Weight      Height      Head Circumference      Peak Flow      Pain Score      Pain Loc      Pain Edu?      Excl. in GC?    No data found.  Updated Vital Signs BP 117/80   Pulse 86   Temp (!) 97.2 F (36.2 C)   Resp 18   LMP 12/14/2018   SpO2 100%   Visual Acuity Right Eye  Distance:   Left Eye Distance:   Bilateral Distance:    Right Eye Near:   Left Eye Near:    Bilateral Near:     Physical Exam Vitals signs and nursing note reviewed.  Constitutional:      General: She is not in acute distress.    Appearance: She is well-developed.  HENT:     Head: Normocephalic and atraumatic.  Eyes:     Conjunctiva/sclera: Conjunctivae normal.  Neck:     Musculoskeletal: Neck supple.  Cardiovascular:     Rate and Rhythm: Normal rate and regular rhythm.     Heart sounds: No murmur.  Pulmonary:     Effort: Pulmonary effort is normal. No respiratory distress.     Breath sounds: Normal breath sounds.  Abdominal:     Palpations: Abdomen is soft.     Tenderness: There is abdominal tenderness.     Comments: Bilateral lower quadrants with tenderness, no focal tenderness, negative rebound, negative Rovsing, negative McBurney's  Genitourinary:    Comments: Deferred Skin:    General: Skin is warm and dry.  Neurological:     Mental Status: She is alert.      UC Treatments / Results  Labs (all labs ordered are listed, but only abnormal results are displayed) Labs Reviewed  POCT URINALYSIS DIP (DEVICE)  POCT PREGNANCY, URINE  CERVICOVAGINAL ANCILLARY ONLY    EKG None  Radiology No results found.  Procedures Procedures (including critical care time)  Medications Ordered in UC Medications  cefTRIAXone (ROCEPHIN) injection 250 mg (has no administration in time range)    Initial Impression / Assessment and Plan / UC Course  I have reviewed the triage vital signs and the nursing notes.  Pertinent labs & imaging results that were available during my care of the patient were reviewed by me and considered in my medical decision making (see chart for details).     Patient with vaginal discharge, exposure to STD.  Will empirically treat for gonorrhea and chlamydia today with Rocephin, will send home with 4 tablets of azithromycin to take at home when she  has food on her stomach per her request.  Also provided refill of MetroGel for BV.  Vaginal swab obtained, will call patient with results and alter treatment as needed.Discussed strict return precautions. Patient verbalized understanding  and is agreeable with plan.  Final Clinical Impressions(s) / UC Diagnoses   Final diagnoses:  Vaginal discharge  Exposure to STD     Discharge Instructions     We have treated you today for gonorrhea, with Rocephin.  Please take 4 tablets of azithromycin at one time once you have food on your stomach.  Please refrain from sexual intercourse for 7 days while medicines eliminating infection.   Continue to use MetroGel at nighttime for BV  We are testing you for Gonorrhea, Chlamydia, Trichomonas, Yeast and Bacterial Vaginosis. We will call you if anything is positive and let you know if you require any further treatment. Please inform partners of any positive results.   Please return if symptoms not improving with treatment, development of fever, nausea, vomiting, abdominal pain.    ED Prescriptions    Medication Sig Dispense Auth. Provider   metroNIDAZOLE (METROGEL) 0.75 % vaginal gel Place 1 Applicatorful vaginally at bedtime for 5 days. 70 g Wieters, Hallie C, PA-C   azithromycin (ZITHROMAX) 250 MG tablet Take 4 tablets (1,000 mg total) by mouth once for 1 dose. Take first 2 tablets together, then 1 every day until finished. 4 tablet Wieters, Hallie C, PA-C     Controlled Substance Prescriptions [place] Controlled Substance Registry consulted? Not Applicable   Lew DawesWieters, Hallie C, [place]PA-C 01/11/19 1358

## 2019-01-12 LAB — CERVICOVAGINAL ANCILLARY ONLY
Bacterial vaginitis: POSITIVE — AB
Candida vaginitis: NEGATIVE
Chlamydia: NEGATIVE
Neisseria Gonorrhea: NEGATIVE
Trichomonas: NEGATIVE

## 2019-01-16 ENCOUNTER — Telehealth (HOSPITAL_COMMUNITY): Payer: Self-pay | Admitting: Emergency Medicine

## 2019-01-16 NOTE — Telephone Encounter (Signed)
Bacterial Vaginosis test is positive.  Prescription for metronidazole was given at the urgent care visit. Pt contacted regarding results. Answered all questions. Verbalized understanding.   

## 2019-05-02 ENCOUNTER — Ambulatory Visit (HOSPITAL_COMMUNITY)
Admission: EM | Admit: 2019-05-02 | Discharge: 2019-05-02 | Disposition: A | Discharge status: Home / Self Care | Payer: Medicaid Other | Attending: Family Medicine | Admitting: Family Medicine

## 2019-05-02 ENCOUNTER — Encounter (HOSPITAL_COMMUNITY): Payer: Self-pay | Admitting: Emergency Medicine

## 2019-05-02 ENCOUNTER — Other Ambulatory Visit: Payer: Self-pay

## 2019-05-02 DIAGNOSIS — R300 Dysuria: Secondary | ICD-10-CM | POA: Diagnosis not present

## 2019-05-02 DIAGNOSIS — Z7251 High risk heterosexual behavior: Secondary | ICD-10-CM | POA: Insufficient documentation

## 2019-05-02 DIAGNOSIS — N898 Other specified noninflammatory disorders of vagina: Secondary | ICD-10-CM | POA: Insufficient documentation

## 2019-05-02 DIAGNOSIS — Z905 Acquired absence of kidney: Secondary | ICD-10-CM | POA: Diagnosis present

## 2019-05-02 DIAGNOSIS — N3001 Acute cystitis with hematuria: Secondary | ICD-10-CM | POA: Diagnosis present

## 2019-05-02 LAB — POCT URINALYSIS DIP (DEVICE)
Bilirubin Urine: NEGATIVE
Glucose, UA: NEGATIVE mg/dL
Ketones, ur: NEGATIVE mg/dL
Nitrite: POSITIVE — AB
Protein, ur: 100 mg/dL — AB
Specific Gravity, Urine: 1.02 (ref 1.005–1.030)
Urobilinogen, UA: 0.2 mg/dL (ref 0.0–1.0)
pH: 7 (ref 5.0–8.0)

## 2019-05-02 LAB — POCT PREGNANCY, URINE: Preg Test, Ur: NEGATIVE

## 2019-05-02 MED ORDER — METRONIDAZOLE 0.75 % VA GEL: 1.0000 | Freq: Two times a day (BID) | VAGINAL | 0 refills | Status: DC

## 2019-05-02 MED ORDER — NITROFURANTOIN MONOHYD MACRO 100 MG PO CAPS: 100.0000 mg | ORAL_CAPSULE | Freq: Two times a day (BID) | ORAL | 0 refills | Status: DC

## 2019-05-02 NOTE — ED Triage Notes (Signed)
Pt states shes had lower pelvic pain, back pain, urinary frequency and vaginal discharge for the last couple of days.
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
## 2019-05-03 LAB — URINE CULTURE

## 2019-05-04 ENCOUNTER — Telehealth (HOSPITAL_COMMUNITY): Payer: Self-pay | Admitting: Emergency Medicine

## 2019-05-04 LAB — CERVICOVAGINAL ANCILLARY ONLY
Bacterial vaginitis: POSITIVE — AB
Candida vaginitis: POSITIVE — AB
Chlamydia: POSITIVE — AB
Neisseria Gonorrhea: NEGATIVE
Trichomonas: NEGATIVE

## 2019-05-04 MED ORDER — AZITHROMYCIN 250 MG PO TABS: 1000.0000 mg | ORAL_TABLET | Freq: Once | ORAL | 0 refills | Status: AC

## 2019-05-04 MED ORDER — FLUCONAZOLE 150 MG PO TABS: 150.0000 mg | ORAL_TABLET | Freq: Once | ORAL | 0 refills | Status: AC

## 2019-05-04 NOTE — Telephone Encounter (Signed)
Test for candida (yeast) was positive.  Prescription for fluconazole 150mg  po now, repeat dose in 3d if needed, #2 no refills, sent to the pharmacy of record.  Recheck or followup with PCP for further evaluation if symptoms are not improving.    Chlamydia is positive.  Rx po zithromax 1g #1 dose no refills was sent to the pharmacy of record.  Pt needs education to please refrain from sexual intercourse for 7 days to give the medicine time to work, sexual partners need to be notified and tested/treated.  Condoms may reduce risk of reinfection.  Recheck or followup with PCP for further evaluation if symptoms are not improving.   [facility] notified.  Bacterial Vaginosis test is positive.  Prescription for metrogel was given at the urgent care visit. Pt contacted regarding results. Answered all questions. Verbalized understanding.   Patient contacted and made aware of    results, all questions answered a

## 2019-05-09 ENCOUNTER — Telehealth (HOSPITAL_COMMUNITY): Payer: Self-pay | Admitting: Emergency Medicine

## 2019-05-09 MED ORDER — AZITHROMYCIN 250 MG PO TABS: 1000.0000 mg | ORAL_TABLET | Freq: Once | ORAL | 0 refills | Status: AC

## 2019-05-09 NOTE — Telephone Encounter (Signed)
Pt stated pharmacy would not let her pick up zithromax without a dx. Resent the medicine with note to pharmacy.

## 2019-09-18 ENCOUNTER — Other Ambulatory Visit: Payer: Self-pay

## 2019-09-18 ENCOUNTER — Encounter (HOSPITAL_COMMUNITY): Payer: Self-pay | Admitting: Emergency Medicine

## 2019-09-18 ENCOUNTER — Ambulatory Visit (HOSPITAL_COMMUNITY)
Admission: EM | Admit: 2019-09-18 | Discharge: 2019-09-18 | Disposition: A | Discharge status: Home / Self Care | Payer: Medicaid Other | Attending: Internal Medicine | Admitting: Internal Medicine

## 2019-09-18 DIAGNOSIS — Z3202 Encounter for pregnancy test, result negative: Secondary | ICD-10-CM | POA: Diagnosis not present

## 2019-09-18 DIAGNOSIS — N760 Acute vaginitis: Secondary | ICD-10-CM | POA: Insufficient documentation

## 2019-09-18 DIAGNOSIS — B9689 Other specified bacterial agents as the cause of diseases classified elsewhere: Secondary | ICD-10-CM | POA: Diagnosis not present

## 2019-09-18 LAB — POCT PREGNANCY, URINE: Preg Test, Ur: NEGATIVE

## 2019-09-18 LAB — POCT URINALYSIS DIP (DEVICE)
Bilirubin Urine: NEGATIVE
Glucose, UA: NEGATIVE mg/dL
Hgb urine dipstick: NEGATIVE
Ketones, ur: NEGATIVE mg/dL
Leukocytes,Ua: NEGATIVE
Nitrite: NEGATIVE
Protein, ur: NEGATIVE mg/dL
Specific Gravity, Urine: 1.02 (ref 1.005–1.030)
Urobilinogen, UA: 0.2 mg/dL (ref 0.0–1.0)
pH: 7 (ref 5.0–8.0)

## 2019-09-18 LAB — POC URINE PREG, ED
Preg Test, Ur: NEGATIVE
Preg Test, Ur: NEGATIVE

## 2019-09-18 MED ORDER — METRONIDAZOLE 500 MG PO TABS: 500.0000 mg | ORAL_TABLET | Freq: Two times a day (BID) | ORAL | 0 refills | Status: DC

## 2019-09-18 NOTE — ED Triage Notes (Addendum)
Lower abdominal pain, watery discharge and odor.  Symptoms intermittent.  Patient has been intermittently using metronidazole gel.  symptoms intermittently for 2 weeks   Patient requesting std testing

## 2019-09-18 NOTE — ED Provider Notes (Signed)
[name]    CSN: [id number] Arrival date & time: 09/18/19  1505      History   Chief Complaint Chief Complaint  Patient presents with  . Vaginal Discharge    HPI Angela Mercado is a 33 y.o. female with history of chronic kidney disease comes to urgent care with complaints of fishy smelling clear vaginal discharge with some pruritus of 2 weeks duration.  Patient tried metronidazole gel with no significant improvement.  Patient says this is a recurrent problem.  She admits douching after every menstrual bleed.   No nausea or vomiting.  Patient denies any . she is also requesting STD screening given that she has had multiple sexual partners recently.  HPI  Past Medical History:  Diagnosis Date  . Chronic kidney disease   . History of HPV infection   . History of syphilis   . Hx of chlamydia infection   . Hx of gonorrhea     Patient Active Problem List   Diagnosis Date Noted  . Positive GBS test 07/05/2014  . Hx of pre-eclampsia in prior pregnancy, currently pregnant 07/05/2014  . Hx of breast augmentation 2010 07/05/2014  . Vaginal delivery 07/05/2014  . Smoker 07/04/2014  . Rubella non-immune status, antepartum 07/04/2014  . Frequent UTI 05/12/2013    Past Surgical History:  Procedure Laterality Date  . PLACEMENT OF BREAST IMPLANTS    . WISDOM TOOTH EXTRACTION      OB History    Gravida  4   Para  3   Term  3   Preterm      AB  1   Living  3     SAB  1   TAB      Ectopic      Multiple      Live Births  3            Home Medications    Prior to Admission medications   Medication Sig Start Date End Date Taking? Authorizing Provider  metroNIDAZOLE (METROGEL) 0.75 % vaginal gel Place 1 Applicatorful vaginally 2 (two) times daily. 05/02/19  Yes Jaynee Eagles, PA-C  ibuprofen (ADVIL,MOTRIN) 600 MG tablet Take 1 tablet (600 mg total) by mouth every 6 (six) hours as needed. 07/07/14   Donnel Saxon, CNM  metroNIDAZOLE (FLAGYL) 500 MG  tablet Take 1 tablet (500 mg total) by mouth 2 (two) times daily. 09/18/19   LampteyMyrene Galas, MD    Family History Family History  Problem Relation Age of Onset  . COPD Father     Social History Social History   Tobacco Use  . Smoking status: Former Smoker    Quit date: 06/05/2013    Years since quitting: 6.2  Substance Use Topics  . Alcohol use: Yes  . Drug use: No     Allergies   Oxycodone-acetaminophen   Review of Systems Review of Systems  Constitutional: Negative.   HENT: Negative.   Respiratory: Negative.   Gastrointestinal: Negative for diarrhea, nausea and vomiting.  Genitourinary: Positive for vaginal discharge. Negative for dysuria, flank pain, genital sores, hematuria, urgency, vaginal bleeding and vaginal pain.  Musculoskeletal: Negative.   Skin: Negative.   Neurological: Negative for dizziness, weakness, light-headedness and headaches.     Physical Exam Triage Vital Signs ED Triage Vitals  Enc Vitals Group     BP 09/18/19 1555 111/72     Pulse Rate 09/18/19 1555 69     Resp 09/18/19 1555 18     Temp 09/18/19  1555 98.5 F (36.9 C)     Temp Source 09/18/19 1555 Oral     SpO2 09/18/19 1555 100 %     Weight --      Height --      Head Circumference --      Peak Flow --      Pain Score 09/18/19 1551 1     Pain Loc --      Pain Edu? --      Excl. in GC? --    No data found.  Updated Vital Signs BP 111/72 (BP Location: Left Arm)   Pulse 69   Temp 98.5 F (36.9 C) (Oral)   Resp 18   LMP 09/01/2019   SpO2 100%   Visual Acuity Right Eye Distance:   Left Eye Distance:   Bilateral Distance:    Right Eye Near:   Left Eye Near:    Bilateral Near:     Physical Exam Constitutional:      General: She is not in acute distress.    Appearance: She is not ill-appearing.  Cardiovascular:     Rate and Rhythm: Normal rate and regular rhythm.     Pulses: Normal pulses.     Heart sounds: Normal heart sounds.  Pulmonary:     Effort: Pulmonary  effort is normal.     Breath sounds: Normal breath sounds. No wheezing or rhonchi.  Abdominal:     General: Bowel sounds are normal.     Palpations: Abdomen is soft.  Musculoskeletal:        General: No swelling or signs of injury. Normal range of motion.  Skin:    General: Skin is warm.     Capillary Refill: Capillary refill takes less than 2 seconds.  Neurological:     Mental Status: She is alert.      UC Treatments / Results  Labs (all labs ordered are listed, but only abnormal results are displayed) Labs Reviewed  POCT URINALYSIS DIP (DEVICE)  POCT PREGNANCY, URINE  POC URINE PREG, ED  POC URINE PREG, ED  CERVICOVAGINAL ANCILLARY ONLY    EKG   Radiology No results found.  Procedures Procedures (including critical care time)  Medications Ordered in UC Medications - No data to display  Initial Impression / Assessment and Plan / UC Course  I have reviewed the triage vital signs and the nursing notes.  Pertinent labs & imaging results that were available during my care of the patient were reviewed by me and considered in my medical decision making (see chart for details).     1.  Vaginal discharge likely bacterial vaginosis: Metronidazole 500 mg twice daily x7 days Over-the-counter probiotics Patient is counseled against douching Cervicovaginal swab for GC/chlamydia/trichomonas/BV/Candida Patient is advised to return to urgent care if symptoms do not improve. Final Clinical Impressions(s) / UC Diagnoses   Final diagnoses:  BV (bacterial vaginosis)   Discharge Instructions   None    ED Prescriptions    Medication Sig Dispense Auth. Provider   metroNIDAZOLE (FLAGYL) 500 MG tablet Take 1 tablet (500 mg total) by mouth 2 (two) times daily. 14 tablet Rocky Rishel, Britta Mccreedy, MD     PDMP not reviewed this encounter.   Merrilee Jansky, MD 09/18/19 [id number]

## 2019-09-20 LAB — CERVICOVAGINAL ANCILLARY ONLY
Bacterial vaginitis: POSITIVE — AB
Candida vaginitis: NEGATIVE
Chlamydia: NEGATIVE
Neisseria Gonorrhea: NEGATIVE
Trichomonas: NEGATIVE

## 2020-02-18 ENCOUNTER — Ambulatory Visit (HOSPITAL_COMMUNITY)
Admission: EM | Admit: 2020-02-18 | Discharge: 2020-02-18 | Disposition: A | Discharge status: Home / Self Care | Payer: Medicaid Other | Attending: Emergency Medicine | Admitting: Emergency Medicine

## 2020-02-18 ENCOUNTER — Other Ambulatory Visit: Payer: Self-pay

## 2020-02-18 DIAGNOSIS — N390 Urinary tract infection, site not specified: Secondary | ICD-10-CM | POA: Diagnosis not present

## 2020-02-18 LAB — POCT URINALYSIS DIP (DEVICE)
Bilirubin Urine: NEGATIVE
Glucose, UA: NEGATIVE mg/dL
Nitrite: POSITIVE — AB
Protein, ur: >=300 mg/dL — AB
Specific Gravity, Urine: >=1.03 (ref 1.005–1.030)
Urobilinogen, UA: 0.2 mg/dL (ref 0.0–1.0)
pH: 6 (ref 5.0–8.0)

## 2020-02-18 LAB — POC URINE PREG, ED: Preg Test, Ur: NEGATIVE

## 2020-02-18 MED ORDER — NITROFURANTOIN MONOHYD MACRO 100 MG PO CAPS: 100.0000 mg | ORAL_CAPSULE | Freq: Two times a day (BID) | ORAL | 0 refills | Status: AC

## 2020-02-18 NOTE — ED Provider Notes (Signed)
[place]    CSN: [id number] Arrival date & time: 02/18/20  1616      History   Chief Complaint Chief Complaint  Patient presents with  . Urinary Frequency    HPI Angela Mercado is a 34 y.o. female history of CKD presenting today for evaluation of possible UTI.  Patient notes that this morning she woke up with dysuria and urinary frequency.  Reports some mild lower abdominal pressure.  Denies any fevers nausea or vomiting.  Denies back pain.  Denies vaginal symptoms of discharge itching, irritation or odor.  Last menstrual cycle was early May.  HPI  Past Medical History:  Diagnosis Date  . Chronic kidney disease   . History of HPV infection   . History of syphilis   . Hx of chlamydia infection   . Hx of gonorrhea     Patient Active Problem List   Diagnosis Date Noted  . Positive GBS test 07/05/2014  . Hx of pre-eclampsia in prior pregnancy, currently pregnant 07/05/2014  . Hx of breast augmentation 2010 07/05/2014  . Vaginal delivery 07/05/2014  . Smoker 07/04/2014  . Rubella non-immune status, antepartum 07/04/2014  . Frequent UTI 05/12/2013    Past Surgical History:  Procedure Laterality Date  . PLACEMENT OF BREAST IMPLANTS    . WISDOM TOOTH EXTRACTION      OB History    Gravida  4   Para  3   Term  3   Preterm      AB  1   Living  3     SAB  1   TAB      Ectopic      Multiple      Live Births  3            Home Medications    Prior to Admission medications   Medication Sig Start Date End Date Taking? Authorizing Provider  ibuprofen (ADVIL,MOTRIN) 600 MG tablet Take 1 tablet (600 mg total) by mouth every 6 (six) hours as needed. 07/07/14   Donnel Saxon, CNM  nitrofurantoin, macrocrystal-monohydrate, (MACROBID) 100 MG capsule Take 1 capsule (100 mg total) by mouth 2 (two) times daily for 5 days. 02/18/20 02/23/20  Gwenetta Devos, Elesa Hacker, PA-C    Family History Family History  Problem Relation Age of Onset  . COPD Father      Social History Social History   Tobacco Use  . Smoking status: Former Smoker    Quit date: 06/05/2013    Years since quitting: 6.7  Substance Use Topics  . Alcohol use: Yes  . Drug use: No     Allergies   Oxycodone-acetaminophen   Review of Systems Review of Systems  Constitutional: Negative for fever.  Respiratory: Negative for shortness of breath.   Cardiovascular: Negative for chest pain.  Gastrointestinal: Negative for abdominal pain, diarrhea, nausea and vomiting.  Genitourinary: Positive for dysuria and frequency. Negative for flank pain, genital sores, hematuria, menstrual problem, vaginal bleeding, vaginal discharge and vaginal pain.  Musculoskeletal: Negative for back pain.  Skin: Negative for rash.  Neurological: Negative for dizziness, light-headedness and headaches.     Physical Exam Triage Vital Signs ED Triage Vitals  Enc Vitals Group     BP      Pulse      Resp      Temp      Temp src      SpO2      Weight      Height  Head Circumference      Peak Flow      Pain Score      Pain Loc      Pain Edu?      Excl. in GC?    No data found.  Updated Vital Signs BP 115/73   Pulse 62   Temp 98 F (36.7 C)   Resp 16   SpO2 99%   Visual Acuity Right Eye Distance:   Left Eye Distance:   Bilateral Distance:    Right Eye Near:   Left Eye Near:    Bilateral Near:     Physical Exam Vitals and nursing note reviewed.  Constitutional:      Appearance: She is well-developed.     Comments: No acute distress  HENT:     Head: Normocephalic and atraumatic.     Nose: Nose normal.  Eyes:     Conjunctiva/sclera: Conjunctivae normal.  Cardiovascular:     Rate and Rhythm: Normal rate.  Pulmonary:     Effort: Pulmonary effort is normal. No respiratory distress.  Abdominal:     General: There is no distension.     Comments: Soft, nondistended, nontender to light and deep palpation throughout upper abdomen, does have tenderness over mid lower  abdomen and suprapubic area  Musculoskeletal:        General: Normal range of motion.     Cervical back: Neck supple.  Skin:    General: Skin is warm and dry.  Neurological:     Mental Status: She is alert and oriented to person, place, and time.      UC Treatments / Results  Labs (all labs ordered are listed, but only abnormal results are displayed) Labs Reviewed  POCT URINALYSIS DIP (DEVICE) - Abnormal; Notable for the following components:      Result Value   Ketones, ur TRACE (*)    Hgb urine dipstick MODERATE (*)    Protein, ur >=300 (*)    Nitrite POSITIVE (*)    Leukocytes,Ua SMALL (*)    All other components within normal limits  URINE CULTURE  POC URINE PREG, ED    EKG   Radiology No results found.  Procedures Procedures (including critical care time)  Medications Ordered in UC Medications - No data to display  Initial Impression / Assessment and Plan / UC Course  I have reviewed the triage vital signs and the nursing notes.  Pertinent labs & imaging results that were available during my care of the patient were reviewed by me and considered in my medical decision making (see chart for details).     UA consistent with UTI, culture pending.  Empirically treating for UTI with Macrobid twice daily x5 days.  Push fluids.  Discussed strict return precautions. Patient verbalized understanding and is agreeable with plan.  Final Clinical Impressions(s) / UC Diagnoses   Final diagnoses:  Lower urinary tract infectious disease     Discharge Instructions     Urine showed evidence of infection. We are treating you with macrobid-twice daily for 5 days. Be sure to take full course. Stay hydrated- urine should be pale yellow to clear.   Please return or follow up with your primary provider if symptoms not improving with treatment. Please return sooner if you have worsening of symptoms or develop fever, nausea, vomiting, abdominal pain, back pain,  lightheadedness, dizziness.    ED Prescriptions    Medication Sig Dispense Auth. Provider   nitrofurantoin, macrocrystal-monohydrate, (MACROBID) 100 MG capsule Take 1 capsule (  100 mg total) by mouth 2 (two) times daily for 5 days. 10 capsule Leny Morozov, [place] C, PA-C     PDMP not reviewed this encounter.   Lew Dawes, [place] 02/18/20 1646

## 2020-02-18 NOTE — ED Triage Notes (Signed)
Pt c/o dysuria and urinary frequency since this AM. No fevers.

## 2020-02-18 NOTE — Discharge Instructions (Signed)
Urine showed evidence of infection. We are treating you with macrobid- twice daily for 5 days. Be sure to take full course. Stay hydrated- urine should be pale yellow to clear.  °Please return or follow up with your primary provider if symptoms not improving with treatment. Please return sooner if you have worsening of symptoms or develop fever, nausea, vomiting, abdominal pain, back pain, lightheadedness, dizziness. °

## 2020-02-20 LAB — URINE CULTURE: Culture: >=100000 — AB

## 2020-02-26 ENCOUNTER — Other Ambulatory Visit: Payer: Self-pay

## 2020-02-26 ENCOUNTER — Emergency Department (HOSPITAL_COMMUNITY)
Admission: EM | Admit: 2020-02-26 | Discharge: 2020-02-27 | Disposition: A | Discharge status: Home / Self Care | Payer: Medicaid Other | Attending: Emergency Medicine | Admitting: Emergency Medicine

## 2020-02-26 ENCOUNTER — Encounter (HOSPITAL_COMMUNITY): Payer: Self-pay

## 2020-02-26 DIAGNOSIS — Z5321 Procedure and treatment not carried out due to patient leaving prior to being seen by health care provider: Secondary | ICD-10-CM | POA: Diagnosis not present

## 2020-02-26 DIAGNOSIS — N390 Urinary tract infection, site not specified: Secondary | ICD-10-CM | POA: Insufficient documentation

## 2020-02-26 LAB — URINALYSIS, ROUTINE W REFLEX MICROSCOPIC
Bilirubin Urine: NEGATIVE
Glucose, UA: NEGATIVE mg/dL
Ketones, ur: NEGATIVE mg/dL
Nitrite: POSITIVE — AB
Protein, ur: NEGATIVE mg/dL
Specific Gravity, Urine: 1.001 — ABNORMAL LOW (ref 1.005–1.030)
pH: 6 (ref 5.0–8.0)

## 2020-02-26 NOTE — ED Triage Notes (Signed)
Dx 5/16 with UTI. Took bactrim full course and it has not worked.

## 2020-02-27 NOTE — ED Notes (Signed)
Pt sts they she is leaving and will be going somewhere else tomorrow. VS stable and no signs of distress.

## 2020-04-13 ENCOUNTER — Other Ambulatory Visit: Payer: Self-pay

## 2020-04-13 ENCOUNTER — Ambulatory Visit (HOSPITAL_COMMUNITY)
Admission: EM | Admit: 2020-04-13 | Discharge: 2020-04-13 | Disposition: A | Discharge status: Home / Self Care | Payer: Medicaid Other | Attending: Family Medicine | Admitting: Family Medicine

## 2020-04-13 ENCOUNTER — Encounter (HOSPITAL_COMMUNITY): Payer: Self-pay | Admitting: Emergency Medicine

## 2020-04-13 DIAGNOSIS — Z3202 Encounter for pregnancy test, result negative: Secondary | ICD-10-CM | POA: Diagnosis present

## 2020-04-13 DIAGNOSIS — N3000 Acute cystitis without hematuria: Secondary | ICD-10-CM | POA: Diagnosis present

## 2020-04-13 DIAGNOSIS — R21 Rash and other nonspecific skin eruption: Secondary | ICD-10-CM

## 2020-04-13 DIAGNOSIS — N390 Urinary tract infection, site not specified: Secondary | ICD-10-CM | POA: Diagnosis not present

## 2020-04-13 DIAGNOSIS — Z113 Encounter for screening for infections with a predominantly sexual mode of transmission: Secondary | ICD-10-CM

## 2020-04-13 LAB — POCT URINALYSIS DIP (DEVICE)
Bilirubin Urine: NEGATIVE
Glucose, UA: NEGATIVE mg/dL
Ketones, ur: NEGATIVE mg/dL
Nitrite: POSITIVE — AB
Protein, ur: 30 mg/dL — AB
Specific Gravity, Urine: 1.025 (ref 1.005–1.030)
Urobilinogen, UA: 0.2 mg/dL (ref 0.0–1.0)
pH: 7 (ref 5.0–8.0)

## 2020-04-13 LAB — POC URINE PREG, ED: Preg Test, Ur: NEGATIVE

## 2020-04-13 MED ORDER — NITROFURANTOIN MONOHYD MACRO 100 MG PO CAPS: 100.0000 mg | ORAL_CAPSULE | Freq: Two times a day (BID) | ORAL | 0 refills | Status: DC

## 2020-04-13 MED ORDER — PHENAZOPYRIDINE HCL 200 MG PO TABS: 200.0000 mg | ORAL_TABLET | Freq: Three times a day (TID) | ORAL | 0 refills | Status: DC

## 2020-04-13 MED ORDER — CLOBETASOL PROPIONATE 0.05 % EX GEL: CUTANEOUS | 0 refills | Status: DC

## 2020-04-13 MED ORDER — CIPROFLOXACIN HCL 500 MG PO TABS: 500.0000 mg | ORAL_TABLET | Freq: Two times a day (BID) | ORAL | 0 refills | Status: DC

## 2020-04-13 NOTE — ED Provider Notes (Addendum)
[facility]   CC: UTI  SUBJECTIVE:  Angela Mercado is a 34 y.o. female who complains of urinary frequency, urgency and dysuria for the past 3 days.  Also reports that she is having malodorous urine that is cloudy.  Has history of UTIs.  Reports low abdominal pain.  Has not taken any medications for this over-the-counter. Symptoms are made worse with urination.  Admits to similar symptoms in the past.  Denies fever, chills, nausea, vomiting, abdominal pain, flank pain, abnormal vaginal discharge or bleeding, hematuria.    LMP: Patient's last menstrual period was 03/27/2020.  ROS: As in HPI.  All other pertinent ROS negative.     Past Medical History:  Diagnosis Date  . Chronic kidney disease   . History of HPV infection   . History of syphilis   . Hx of chlamydia infection   . Hx of gonorrhea    Past Surgical History:  Procedure Laterality Date  . PLACEMENT OF BREAST IMPLANTS    . WISDOM TOOTH EXTRACTION     Allergies  Allergen Reactions  . Oxycodone-Acetaminophen Itching   No current facility-administered medications on file prior to encounter.   Current Outpatient Medications on File Prior to Encounter  Medication Sig Dispense Refill  . ibuprofen (ADVIL,MOTRIN) 600 MG tablet Take 1 tablet (600 mg total) by mouth every 6 (six) hours as needed. 36 tablet 2   Social History   Socioeconomic History  . Marital status: Single    Spouse name: Not on file  . Number of children: Not on file  . Years of education: Not on file  . Highest education level: Not on file  Occupational History  . Not on file  Tobacco Use  . Smoking status: Former Smoker    Quit date: 06/05/2013    Years since quitting: 6.8  . Smokeless tobacco: Never Used  Vaping Use  . Vaping Use: Never used  Substance and Sexual Activity  . Alcohol use: Yes  . Drug use: No  . Sexual activity: Yes    Birth control/protection: None  Other Topics Concern  . Not on file  Social History Narrative  .  Not on file   Social Determinants of Health   Financial Resource Strain:   . Difficulty of Paying Living Expenses:   Food Insecurity:   . Worried About Programme researcher, broadcasting/film/video in the Last Year:   . Barista in the Last Year:   Transportation Needs:   . Freight forwarder (Medical):   Marland Kitchen Lack of Transportation (Non-Medical):   Physical Activity:   . Days of Exercise per Week:   . Minutes of Exercise per Session:   Stress:   . Feeling of Stress :   Social Connections:   . Frequency of Communication with Friends and Family:   . Frequency of Social Gatherings with Friends and Family:   . Attends Religious Services:   . Active Member of Clubs or Organizations:   . Attends Banker Meetings:   Marland Kitchen Marital Status:   Intimate Partner Violence:   . Fear of Current or Ex-Partner:   . Emotionally Abused:   Marland Kitchen Physically Abused:   . Sexually Abused:    Family History  Problem Relation Age of Onset  . COPD Father     OBJECTIVE:  Vitals:   04/13/20 1742  BP: 113/76  Pulse: 79  Resp: 16  Temp: 98.3 F (36.8 C)  TempSrc: Oral  SpO2: 100%   General appearance:  AOx3 in no acute distress HEENT: NCAT.  Oropharynx clear.  Lungs: clear to auscultation bilaterally without adventitious breath sounds Heart: regular rate and rhythm.  Radial pulses 2+ symmetrical bilaterally Abdomen: soft; non-distended; no tenderness; bowel sounds present; no guarding or rebound tenderness Back: no CVA tenderness Extremities: no edema; symmetrical with no gross deformities Skin: warm and dry Neurologic: Ambulates from chair to exam table without difficulty Psychological: alert and cooperative; normal mood and affect  Labs Reviewed  POCT URINALYSIS DIP (DEVICE) - Abnormal; Notable for the following components:      Result Value   Hgb urine dipstick TRACE (*)    Protein, ur 30 (*)    Nitrite POSITIVE (*)    Leukocytes,Ua LARGE (*)    All other components within normal limits    URINE CULTURE  POC URINE PREG, ED  CERVICOVAGINAL ANCILLARY ONLY    ASSESSMENT & PLAN:  1. Acute cystitis without hematuria   2. Rash and nonspecific skin eruption   3. Screen for STD (sexually transmitted disease)   4. Negative pregnancy test     Meds ordered this encounter  Medications  . DISCONTD: nitrofurantoin, macrocrystal-monohydrate, (MACROBID) 100 MG capsule    Sig: Take 1 capsule (100 mg total) by mouth 2 (two) times daily.    Dispense:  10 capsule    Refill:  0    Order Specific Question:   Supervising Provider    Answer:   Merrilee Jansky [id number]  . phenazopyridine (PYRIDIUM) 200 MG tablet    Sig: Take 1 tablet (200 mg total) by mouth 3 (three) times daily.    Dispense:  6 tablet    Refill:  0    Order Specific Question:   Supervising Provider    Answer:   Merrilee Jansky [id number]  . clobetasol (TEMOVATE) 0.05 % GEL    Sig: Apply to affected area twice daily as needed for rash itching and burning    Dispense:  30 g    Refill:  0    Order Specific Question:   Supervising Provider    Answer:   Merrilee Jansky [id number]  . ciprofloxacin (CIPRO) 500 MG tablet    Sig: Take 1 tablet (500 mg total) by mouth 2 (two) times daily.    Dispense:  14 tablet    Refill:  0    Order Specific Question:   Supervising Provider    Answer:   Merrilee Jansky [[id number]]   Rash Papular pruritic rash over the color ink on her tattoo that covers the right hip Prescribe clobetasol  STD screen Declines HIV and syphilis testing today Self swab obtained Will call with positive results any treatment  Negative pregnancy test today  UA positive for nitrites in office Reports that Macrobid usually does not get rid of her UTI5 Urine culture sent.   We will call you with abnormal results that need further treatment.   Push fluids and get plenty of rest.   Take antibiotic as directed and to completion Take pyridium as prescribed and as needed for symptomatic relief Follow  up with PCP if symptoms persists Return here or go to ER if you have any new or worsening symptoms such as fever, worsening abdominal pain, nausea/vomiting, flank pain  Outlined signs and symptoms indicating need for more acute intervention. Patient verbalized understanding. After Visit Summary given.      Moshe Cipro, NP 04/13/20 1807    Moshe Cipro, NP 04/13/20 1811

## 2020-04-13 NOTE — Discharge Instructions (Addendum)
You have a urinary tract infection  I have sent Macrobid into your pharmacy for you to take twice a day for 5 days  I have also sent in Pyridium to take for painful urination  I have sent clobetasol in for itching  Wear sunscreen or cover up the color in your tattoo when you are on the sun  Your swab tests will be back in about 2 days and we will call you with any results that need treatment  Pregnancy test was negative today      Follow-up if you are not feeling better by Tuesday, with this office or with primary care

## 2020-04-13 NOTE — ED Triage Notes (Signed)
Patient presents to urgent care today with symptoms of frequency and urgency with urination along with cloudy and odor. Symptoms began on Thursday. She also has a rash on her anterior right thigh in her tattoo They have tried oat milk baths and naproxen with no relief of symptoms.

## 2020-04-15 LAB — CERVICOVAGINAL ANCILLARY ONLY
Bacterial Vaginitis (gardnerella): POSITIVE — AB
Candida Glabrata: NEGATIVE
Candida Vaginitis: NEGATIVE
Chlamydia: NEGATIVE
Comment: NEGATIVE
Comment: NEGATIVE
Comment: NEGATIVE
Comment: NEGATIVE
Comment: NEGATIVE
Comment: NORMAL
Neisseria Gonorrhea: NEGATIVE
Trichomonas: NEGATIVE

## 2020-04-15 LAB — URINE CULTURE: Culture: >=100000 — AB

## 2020-04-17 ENCOUNTER — Telehealth (HOSPITAL_COMMUNITY): Payer: Self-pay | Admitting: Emergency Medicine

## 2020-04-17 MED ORDER — METRONIDAZOLE 500 MG PO TABS: 500.0000 mg | ORAL_TABLET | Freq: Two times a day (BID) | ORAL | 0 refills | Status: DC

## 2020-04-22 ENCOUNTER — Telehealth (HOSPITAL_COMMUNITY): Payer: Self-pay | Admitting: Emergency Medicine

## 2020-04-22 NOTE — Telephone Encounter (Signed)
Spoke to pt given results and verbalized understanding of results

## 2020-05-15 ENCOUNTER — Encounter (HOSPITAL_COMMUNITY): Payer: Self-pay | Admitting: Emergency Medicine

## 2020-05-15 ENCOUNTER — Other Ambulatory Visit: Payer: Self-pay

## 2020-05-15 ENCOUNTER — Emergency Department (HOSPITAL_COMMUNITY): Payer: Medicaid Other

## 2020-05-15 ENCOUNTER — Emergency Department (HOSPITAL_COMMUNITY)
Admission: EM | Admit: 2020-05-15 | Discharge: 2020-05-16 | Disposition: A | Discharge status: Home / Self Care | Payer: Medicaid Other | Attending: Emergency Medicine | Admitting: Emergency Medicine

## 2020-05-15 DIAGNOSIS — R05 Cough: Secondary | ICD-10-CM | POA: Insufficient documentation

## 2020-05-15 DIAGNOSIS — Z87891 Personal history of nicotine dependence: Secondary | ICD-10-CM | POA: Diagnosis not present

## 2020-05-15 DIAGNOSIS — R0602 Shortness of breath: Secondary | ICD-10-CM

## 2020-05-15 DIAGNOSIS — R0789 Other chest pain: Secondary | ICD-10-CM | POA: Insufficient documentation

## 2020-05-15 DIAGNOSIS — U071 COVID-19: Secondary | ICD-10-CM

## 2020-05-15 DIAGNOSIS — R197 Diarrhea, unspecified: Secondary | ICD-10-CM | POA: Diagnosis not present

## 2020-05-15 DIAGNOSIS — Z20822 Contact with and (suspected) exposure to covid-19: Secondary | ICD-10-CM | POA: Diagnosis not present

## 2020-05-15 DIAGNOSIS — R112 Nausea with vomiting, unspecified: Secondary | ICD-10-CM | POA: Insufficient documentation

## 2020-05-15 DIAGNOSIS — Z79899 Other long term (current) drug therapy: Secondary | ICD-10-CM | POA: Diagnosis not present

## 2020-05-15 LAB — I-STAT BETA HCG BLOOD, ED (MC, WL, AP ONLY): I-stat hCG, quantitative: <5 m[IU]/mL (ref <5)

## 2020-05-15 NOTE — ED Triage Notes (Signed)
Patient is complaining of having covid, diarrhea, nausea, vomiting, and sob. Patient states that [facility] told her she was covid positive because she was in the room with her boyfriend that was covid positive. Patient states that she has been sick since last Monday.

## 2020-05-16 LAB — SARS CORONAVIRUS 2 BY RT PCR (HOSPITAL ORDER, PERFORMED IN ~~LOC~~ HOSPITAL LAB): SARS Coronavirus 2: NEGATIVE

## 2020-05-16 LAB — CBC WITH DIFFERENTIAL/PLATELET
Abs Immature Granulocytes: 0.07 10*3/uL (ref 0.00–0.07)
Basophils Absolute: 0 10*3/uL (ref 0.0–0.1)
Basophils Relative: 1 %
Eosinophils Absolute: 0.1 10*3/uL (ref 0.0–0.5)
Eosinophils Relative: 1 %
HCT: 39.7 % (ref 36.0–46.0)
Hemoglobin: 13.4 g/dL (ref 12.0–15.0)
Immature Granulocytes: 1 %
Lymphocytes Relative: 31 %
Lymphs Abs: 1.9 10*3/uL (ref 0.7–4.0)
MCH: 32.3 pg (ref 26.0–34.0)
MCHC: 33.8 g/dL (ref 30.0–36.0)
MCV: 95.7 fL (ref 80.0–100.0)
Monocytes Absolute: 0.5 10*3/uL (ref 0.1–1.0)
Monocytes Relative: 7 %
Neutro Abs: 3.7 10*3/uL (ref 1.7–7.7)
Neutrophils Relative %: 59 %
Platelets: 310 10*3/uL (ref 150–400)
RBC: 4.15 MIL/uL (ref 3.87–5.11)
RDW: 12.3 % (ref 11.5–15.5)
WBC: 6.3 10*3/uL (ref 4.0–10.5)
nRBC: 0 % (ref 0.0–0.2)

## 2020-05-16 LAB — COMPREHENSIVE METABOLIC PANEL
ALT: 20 U/L (ref 0–44)
AST: 27 U/L (ref 15–41)
Albumin: 4.3 g/dL (ref 3.5–5.0)
Alkaline Phosphatase: 66 U/L (ref 38–126)
Anion gap: 10 (ref 5–15)
BUN: 14 mg/dL (ref 6–20)
CO2: 24 mmol/L (ref 22–32)
Calcium: 9.2 mg/dL (ref 8.9–10.3)
Chloride: 105 mmol/L (ref 98–111)
Creatinine, Ser: 0.79 mg/dL (ref 0.44–1.00)
GFR calc Af Amer: >60 mL/min (ref >60)
GFR calc non Af Amer: >60 mL/min (ref >60)
Glucose, Bld: 94 mg/dL (ref 70–99)
Potassium: 3.7 mmol/L (ref 3.5–5.1)
Sodium: 139 mmol/L (ref 135–145)
Total Bilirubin: 0.9 mg/dL (ref 0.3–1.2)
Total Protein: 7 g/dL (ref 6.5–8.1)

## 2020-05-16 LAB — LACTIC ACID, PLASMA: Lactic Acid, Venous: 0.8 mmol/L (ref 0.5–1.9)

## 2020-05-16 MED ORDER — LORAZEPAM 2 MG/ML IJ SOLN
0.5000 mg | Freq: Once | INTRAMUSCULAR | Status: AC
  Administered 2020-05-16: 0.5 mg via INTRAVENOUS
  Filled 2020-05-16: qty 1

## 2020-05-16 MED ORDER — AEROCHAMBER Z-STAT PLUS/MEDIUM MISC
1.0000 | Freq: Once | Status: AC
  Administered 2020-05-16: 1
  Filled 2020-05-16: qty 1

## 2020-05-16 MED ORDER — GUAIFENESIN 100 MG/5ML PO SOLN
5.0000 mL | Freq: Once | ORAL | Status: AC
  Administered 2020-05-16: 100 mg via ORAL
  Filled 2020-05-16: qty 10

## 2020-05-16 MED ORDER — ONDANSETRON HCL 4 MG/2ML IJ SOLN
4.0000 mg | Freq: Once | INTRAMUSCULAR | Status: AC
  Administered 2020-05-16: 4 mg via INTRAVENOUS
  Filled 2020-05-16: qty 2

## 2020-05-16 MED ORDER — LOPERAMIDE HCL 2 MG PO CAPS
4.0000 mg | ORAL_CAPSULE | Freq: Once | ORAL | Status: AC
  Administered 2020-05-16: 4 mg via ORAL
  Filled 2020-05-16: qty 2

## 2020-05-16 MED ORDER — SODIUM CHLORIDE 0.9 % IV BOLUS
1000.0000 mL | Freq: Once | INTRAVENOUS | Status: AC
  Administered 2020-05-16: 1000 mL via INTRAVENOUS

## 2020-05-16 MED ORDER — BENZONATATE 100 MG PO CAPS
100.0000 mg | ORAL_CAPSULE | Freq: Three times a day (TID) | ORAL | 0 refills | Status: DC
Start: 2020-05-16 — End: 2021-03-06

## 2020-05-16 MED ORDER — LOPERAMIDE HCL 2 MG PO CAPS
2.0000 mg | ORAL_CAPSULE | Freq: Four times a day (QID) | ORAL | 0 refills | Status: DC | PRN
Start: 2020-05-16 — End: 2021-03-06

## 2020-05-16 MED ORDER — ONDANSETRON 4 MG PO TBDP: 4.0000 mg | ORAL_TABLET | Freq: Three times a day (TID) | ORAL | 0 refills | Status: DC | PRN

## 2020-05-16 MED ORDER — ALBUTEROL SULFATE HFA 108 (90 BASE) MCG/ACT IN AERS
8.0000 | INHALATION_SPRAY | Freq: Once | RESPIRATORY_TRACT | Status: AC
  Administered 2020-05-16: 8 via RESPIRATORY_TRACT
  Filled 2020-05-16: qty 6.7

## 2020-05-16 NOTE — ED Provider Notes (Signed)
[facility] Provider Note   CSN: [id number] Arrival date & time: 05/15/20  2144     History Chief Complaint  Patient presents with  . Shortness of Breath    Angela Mercado is a 34 y.o. female with a history of syphilis, chlamydia, gonorrhea, HPV, and chronic kidney disease who presents to the emergency department with a chief complaint of cough.  The patient reports that she developed URI symptoms approximately 10 days ago accompanied by diarrhea.  She reports that she has had frequent, watery diarrhea that turned dark after she started taking Pepto-Bismol for her symptoms.  She developed nonbloody, nonbilious vomiting today and has had chest tightness that is worse with laying flat.  She also endorses shortness of breath that is worse with exertion and a persistent nonproductive cough.  No fevers, chills, abdominal pain, back pain, leg swelling, rash, dizziness, lightheadedness, or syncope.  She reports that her significant other was diagnosed with COVID-19 and is currently hospitalized in the ICU.  She reports that she has been feeling increasingly anxious about his hospitalization.  She has not been vaccinated against COVID-19.  She is otherwise been treating her symptoms with TheraFlu with some improvement.  No personal or familial history of PE.  She does not take oral contraceptives.  No recent surgery or immobilization.  The history is provided by the patient. No language interpreter was used.       Past Medical History:  Diagnosis Date  . Chronic kidney disease   . History of HPV infection   . History of syphilis   . Hx of chlamydia infection   . Hx of gonorrhea     Patient Active Problem List   Diagnosis Date Noted  . Positive GBS test 07/05/2014  . Hx of pre-eclampsia in prior pregnancy, currently pregnant 07/05/2014  . Hx of breast augmentation 2010 07/05/2014  . Vaginal delivery 07/05/2014  . Smoker 07/04/2014  . Rubella non-immune  status, antepartum 07/04/2014  . Frequent UTI 05/12/2013    Past Surgical History:  Procedure Laterality Date  . PLACEMENT OF BREAST IMPLANTS    . WISDOM TOOTH EXTRACTION       OB History    Gravida  4   Para  3   Term  3   Preterm      AB  1   Living  3     SAB  1   TAB      Ectopic      Multiple      Live Births  3           Family History  Problem Relation Age of Onset  . COPD Father     Social History   Tobacco Use  . Smoking status: Former Smoker    Quit date: 06/05/2013    Years since quitting: 6.9  . Smokeless tobacco: Never Used  Vaping Use  . Vaping Use: Never used  Substance Use Topics  . Alcohol use: Yes  . Drug use: No    Home Medications Prior to Admission medications   Medication Sig Start Date End Date Taking? Authorizing Provider  benzonatate (TESSALON) 100 MG capsule Take 1 capsule (100 mg total) by mouth every 8 (eight) hours. 05/16/20   Ithiel Liebler A, PA-C  loperamide (IMODIUM) 2 MG capsule Take 1 capsule (2 mg total) by mouth 4 (four) times daily as needed for diarrhea or loose stools. 05/16/20   Crislyn Willbanks A, PA-C  ondansetron (ZOFRAN ODT) 4  MG disintegrating tablet Take 1 tablet (4 mg total) by mouth every 8 (eight) hours as needed. 05/16/20   Clessie Karras A, PA-C    Allergies    Oxycodone-acetaminophen  Review of Systems   Review of Systems  Constitutional: Negative for activity change, chills and fever.  HENT: Positive for congestion. Negative for ear discharge, ear pain, facial swelling, mouth sores, sinus pressure, sinus pain and sore throat.   Respiratory: Positive for cough, chest tightness and shortness of breath.   Cardiovascular: Positive for chest pain. Negative for palpitations.  Gastrointestinal: Positive for diarrhea, nausea and vomiting. Negative for abdominal pain.  Genitourinary: Negative for dysuria and urgency.  Musculoskeletal: Negative for back pain, myalgias, neck pain and neck stiffness.  Skin:  Negative for rash.  Allergic/Immunologic: Negative for immunocompromised state.  Neurological: Negative for seizures, weakness and headaches.  Psychiatric/Behavioral: Negative for confusion.    Physical Exam Updated Vital Signs BP 124/68   Pulse 93   Temp 98.3 F (36.8 C) (Oral)   Resp 20   Ht 5\' 2"  (1.575 m)   Wt 54.4 kg   SpO2 100%   BMI 21.95 kg/m   Physical Exam Vitals and nursing note reviewed.  Constitutional:      General: She is not in acute distress. HENT:     Head: Normocephalic.  Eyes:     Conjunctiva/sclera: Conjunctivae normal.  Neck:     Comments: No meningismus. Cardiovascular:     Rate and Rhythm: Normal rate and regular rhythm.     Heart sounds: No murmur heard.  No friction rub. No gallop.   Pulmonary:     Effort: Pulmonary effort is normal. No respiratory distress.     Comments: Lung sounds are mildly diminished throughout, but otherwise clear to auscultation bilaterally.  No increased work of breathing.  Dry hacking cough noted frequently throughout exam. Abdominal:     General: There is no distension.     Palpations: Abdomen is soft. There is no mass.     Tenderness: There is no abdominal tenderness. There is no right CVA tenderness, left CVA tenderness, guarding or rebound.     Hernia: No hernia is present.  Musculoskeletal:     Cervical back: Normal range of motion and neck supple.  Skin:    General: Skin is warm.     Findings: No rash.  Neurological:     Mental Status: She is alert.  Psychiatric:        Behavior: Behavior normal.     ED Results / Procedures / Treatments   Labs (all labs ordered are listed, but only abnormal results are displayed) Labs Reviewed  SARS CORONAVIRUS 2 BY RT PCR (HOSPITAL ORDER, PERFORMED IN [facility])  CBC WITH DIFFERENTIAL/PLATELET  COMPREHENSIVE METABOLIC PANEL  LACTIC ACID, PLASMA  I-STAT BETA HCG BLOOD, ED ([facility], [facility], [facility] ONLY)    EKG EKG Interpretation  Date/Time:  Wednesday May 15 2020 22:59:22 EDT Ventricular Rate:  57 PR Interval:    QRS Duration: 76 QT Interval:  442 QTC Calculation: 431 R Axis:   95 Text Interpretation: Sinus rhythm Multiple premature complexes, vent & supraven Borderline right axis deviation 12 Lead; Mason-Likar Confirmed by 07[birth year]1991 [phone]) on 05/16/2020 2:36:58 PM   Radiology DG Chest Port 1 View  Result Date: 05/15/2020 CLINICAL DATA:  34 year old female with shortness of breath. EXAM: PORTABLE CHEST 1 VIEW COMPARISON:  Chest radiograph dated 07/26/2012. FINDINGS: The heart size and mediastinal contours are within normal limits. Both lungs  are clear. The visualized skeletal structures are unremarkable. IMPRESSION: No active disease. Electronically Signed   By: Elgie Collard M.D.   On: 05/15/2020 23:32    Procedures Procedures (including critical care time)  Medications Ordered in ED Medications  sodium chloride 0.9 % bolus 1,000 mL (0 mLs Intravenous Stopped 05/16/20 0458)  ondansetron (ZOFRAN) injection 4 mg (4 mg Intravenous Given 05/16/20 0308)  LORazepam (ATIVAN) injection 0.5 mg (0.5 mg Intravenous Given 05/16/20 0310)  loperamide (IMODIUM) capsule 4 mg (4 mg Oral Given 05/16/20 0312)  albuterol (VENTOLIN HFA) 108 (90 Base) MCG/ACT inhaler 8 puff (8 puffs Inhalation Given 05/16/20 0305)  aerochamber Z-Stat Plus/medium 1 each (1 each Other Given 05/16/20 0312)  guaiFENesin (ROBITUSSIN) 100 MG/5ML solution 100 mg (100 mg Oral Given 05/16/20 0457)    ED Course  I have reviewed the triage vital signs and the nursing notes.  Pertinent labs & imaging results that were available during my care of the patient were reviewed by me and considered in my medical decision making (see chart for details).    MDM Rules/Calculators/A&P                          34 year old female with a history of syphilis, chlamydia, gonorrhea, HPV, and chronic kidney disease presenting with shortness of breath, nausea, vomiting, and diarrhea.  Her  significant other is currently hospitalized in the ICU with COVID-19.  Vital signs are reassuring.  She has a dry hacking cough on exam, but no increased work of breathing.  Oxygen saturations 100% on room air.  Labs are reassuring.  No electrolyte derangements.  No elevated lactate.  Chest x-ray is clear.  COVID-19 test is negative.  However, given contact with a known severe case of Covid, and patient's presenting symptoms, will treat as a false negative test.  Abdominal exam is benign.  Doubt diverticulitis, appendicitis, cholecystitis, or pyelonephritis.  Low suspicion for PE as her only risk factor is COVID-19 and she is otherwise PERC negative.  Patient's symptoms were managed in the ER and she was successfully fluid challenged and ambulated on pulse ox without hypoxia.  On reevaluation, she is feeling much better.  Recommended 14-day quarantine despite having a negative test in the ER.  Will discharge home with symptomatic treatment.  She is hemodynamically stable and in no acute distress.  Safe for discharge home with outpatient follow-up as indicated.  Final Clinical Impression(s) / ED Diagnoses Final diagnoses:  COVID-19    Rx / DC Orders ED Discharge Orders         Ordered    loperamide (IMODIUM) 2 MG capsule  4 times daily PRN     Discontinue  Reprint     05/16/20 0540    ondansetron (ZOFRAN ODT) 4 MG disintegrating tablet  Every 8 hours PRN     Discontinue  Reprint     05/16/20 0540    benzonatate (TESSALON) 100 MG capsule  Every 8 hours     Discontinue  Reprint     05/16/20 0540           Barkley Boards, PA-C 05/17/20 3846    Maia Plan, MD 05/17/20 1708

## 2020-05-16 NOTE — Discharge Instructions (Addendum)
Thank you for allowing me to care for you today in the Emergency Department.   Although your COVID-19 test was negative, there is a strong suspicion that your symptoms are related to COVID-19 given that your significant other has also been ill.  You need to quarantine at home for a total of 14 days from when your symptoms began.  Use 2 to 4 puffs of the albuterol inhaler every 4-6 hours to help with chest pain and shortness of breath.  Tessalon Perles can be taken as directed for cough.  You can use Imodium as directed for the next 2 to 3 days to help with diarrhea.  For nausea and vomiting, let 1 tablet of Zofran dissolve in your tongue every 8 hours as needed.  Make sure that you are resting and hydrating.  It may take weeks before your symptoms significantly improve, especially the cough, which may be the last symptom to resolve.  Return to the emergency department if you develop respiratory distress, significant trouble breathing, if you pass out, if your fingers or lips turn blue, or if you develop other new, concerning symptoms.

## 2020-07-02 ENCOUNTER — Encounter (HOSPITAL_COMMUNITY): Payer: Self-pay | Admitting: *Deleted

## 2020-07-02 ENCOUNTER — Other Ambulatory Visit: Payer: Self-pay

## 2020-07-02 ENCOUNTER — Ambulatory Visit (HOSPITAL_COMMUNITY)
Admission: EM | Admit: 2020-07-02 | Discharge: 2020-07-02 | Disposition: A | Discharge status: Home / Self Care | Payer: Medicaid Other | Attending: Emergency Medicine | Admitting: Emergency Medicine

## 2020-07-02 DIAGNOSIS — F411 Generalized anxiety disorder: Secondary | ICD-10-CM | POA: Diagnosis not present

## 2020-07-02 DIAGNOSIS — N898 Other specified noninflammatory disorders of vagina: Secondary | ICD-10-CM

## 2020-07-02 DIAGNOSIS — Z3202 Encounter for pregnancy test, result negative: Secondary | ICD-10-CM

## 2020-07-02 LAB — POCT URINALYSIS DIPSTICK, ED / UC
Bilirubin Urine: NEGATIVE
Glucose, UA: NEGATIVE mg/dL
Hgb urine dipstick: NEGATIVE
Leukocytes,Ua: NEGATIVE
Nitrite: POSITIVE — AB
Protein, ur: NEGATIVE mg/dL
Specific Gravity, Urine: >=1.03 (ref 1.005–1.030)
Urobilinogen, UA: 0.2 mg/dL (ref 0.0–1.0)
pH: 5.5 (ref 5.0–8.0)

## 2020-07-02 LAB — POC URINE PREG, ED: Preg Test, Ur: NEGATIVE

## 2020-07-02 MED ORDER — METRONIDAZOLE 500 MG PO TABS
500.0000 mg | ORAL_TABLET | Freq: Two times a day (BID) | ORAL | 0 refills | Status: DC
Start: 2020-07-02 — End: 2020-07-05

## 2020-07-02 NOTE — ED Triage Notes (Signed)
Patient in with complaints of vaginal discharge that is white in color and has a foul odor for a couple of days. Patient states that discharge is sometimes thick and at other times it runs down her leg. Patient was taking vaginal probiotics from [facility] for symptoms but symptoms returned. Patient also requesting STD testing. Patient denies abdominal pain, frequency or urgency. Patient also requesting pregnancy test. Patient states that she has been throwing up everyday and had an abnormal cycle. Patient states she recently lost her fiancee to COVID on 05/24/20. Patient took non prescription Xanax while in the room with triage nurse. Patient states she took a 1/2 a pill of her cousin's prescription which was 1 mg. Patient states she takes 1/2 a pill during the day and at night.

## 2020-07-02 NOTE — ED Provider Notes (Signed)
[facility]    CSN: [id number] Arrival date & time: 07/02/20  1329      History   Chief Complaint Chief Complaint  Patient presents with  . Vaginal Discharge  . Exposure to STD    HPI Angela Mercado is a 34 y.o. female.   Patient presents with white thin malodorous vaginal discharge x2 to 3 days.  She states this feels similar to previous episodes of BV.  She denies fever, chills, abdominal pain, dysuria, back pain, pelvic pain, rash, lesions, or other symptoms.  No treatment attempted at home.  She also requests a pregnancy test.  Patient positive for bacterial vaginosis in July 2021.  Patient also reports anxiety since her fianc's death in 06/08/23.  She has been taking her cousin's Xanax.  She denies suicidal or homicidal ideation.  She has not seen her PCP for these concerns.  The history is provided by the patient.    Past Medical History:  Diagnosis Date  . Chronic kidney disease   . History of HPV infection   . History of syphilis   . Hx of chlamydia infection   . Hx of gonorrhea     Patient Active Problem List   Diagnosis Date Noted  . Positive GBS test 07/05/2014  . Hx of pre-eclampsia in prior pregnancy, currently pregnant 07/05/2014  . Hx of breast augmentation 2010 07/05/2014  . Vaginal delivery 07/05/2014  . Smoker 07/04/2014  . Rubella non-immune status, antepartum 07/04/2014  . Frequent UTI 05/12/2013    Past Surgical History:  Procedure Laterality Date  . PLACEMENT OF BREAST IMPLANTS    . WISDOM TOOTH EXTRACTION      OB History    Gravida  4   Para  3   Term  3   Preterm      AB  1   Living  3     SAB  1   TAB      Ectopic      Multiple      Live Births  3            Home Medications    Prior to Admission medications   Medication Sig Start Date End Date Taking? Authorizing Provider  benzonatate (TESSALON) 100 MG capsule Take 1 capsule (100 mg total) by mouth every 8 (eight) hours. 05/16/20   McDonald, Mia A,  PA-C  loperamide (IMODIUM) 2 MG capsule Take 1 capsule (2 mg total) by mouth 4 (four) times daily as needed for diarrhea or loose stools. 05/16/20   McDonald, Mia A, PA-C  metroNIDAZOLE (FLAGYL) 500 MG tablet Take 1 tablet (500 mg total) by mouth 2 (two) times daily. 07/02/20   Mickie Bail, NP  ondansetron (ZOFRAN ODT) 4 MG disintegrating tablet Take 1 tablet (4 mg total) by mouth every 8 (eight) hours as needed. 05/16/20   McDonald, Mia A, PA-C    Family History Family History  Problem Relation Age of Onset  . COPD Father     Social History Social History   Tobacco Use  . Smoking status: Former Smoker    Quit date: 06/05/2013    Years since quitting: 7.0  . Smokeless tobacco: Never Used  Vaping Use  . Vaping Use: Never used  Substance Use Topics  . Alcohol use: Yes    Comment: daily  . Drug use: No     Allergies   Oxycodone-acetaminophen   Review of Systems Review of Systems  Constitutional: Negative for chills and fever.  HENT: Negative for ear pain and sore throat.   Eyes: Negative for pain and visual disturbance.  Respiratory: Negative for cough and shortness of breath.   Cardiovascular: Negative for chest pain and palpitations.  Gastrointestinal: Negative for abdominal pain and vomiting.  Genitourinary: Positive for vaginal discharge. Negative for dysuria, flank pain, hematuria and pelvic pain.  Musculoskeletal: Negative for arthralgias and back pain.  Skin: Negative for color change and rash.  Neurological: Negative for seizures and syncope.  Psychiatric/Behavioral: The patient is nervous/anxious.   All other systems reviewed and are negative.    Physical Exam Triage Vital Signs ED Triage Vitals  Enc Vitals Group     BP      Pulse      Resp      Temp      Temp src      SpO2      Weight      Height      Head Circumference      Peak Flow      Pain Score      Pain Loc      Pain Edu?      Excl. in GC?    No data found.  Updated Vital Signs BP (!)  127/95 (BP Location: Left Arm)   Pulse 88   Temp 98.4 F (36.9 C) (Oral)   Resp 18   LMP 06/14/2020   SpO2 99%   Visual Acuity Right Eye Distance:   Left Eye Distance:   Bilateral Distance:    Right Eye Near:   Left Eye Near:    Bilateral Near:     Physical Exam Vitals and nursing note reviewed.  Constitutional:      General: She is not in acute distress.    Appearance: She is well-developed. She is not ill-appearing.  HENT:     Head: Normocephalic and atraumatic.     Mouth/Throat:     Mouth: Mucous membranes are moist.  Eyes:     Conjunctiva/sclera: Conjunctivae normal.  Cardiovascular:     Rate and Rhythm: Normal rate and regular rhythm.     Heart sounds: No murmur heard.   Pulmonary:     Effort: Pulmonary effort is normal. No respiratory distress.     Breath sounds: Normal breath sounds.  Abdominal:     Palpations: Abdomen is soft.     Tenderness: There is no abdominal tenderness. There is no right CVA tenderness, left CVA tenderness, guarding or rebound.  Musculoskeletal:     Cervical back: Neck supple.  Skin:    General: Skin is warm and dry.     Findings: No rash.  Neurological:     General: No focal deficit present.     Mental Status: She is alert and oriented to person, place, and time.     Gait: Gait normal.  Psychiatric:        Mood and Affect: Mood normal.        Behavior: Behavior normal.      UC Treatments / Results  Labs (all labs ordered are listed, but only abnormal results are displayed) Labs Reviewed  POCT URINALYSIS DIPSTICK, ED / UC - Abnormal; Notable for the following components:      Result Value   Ketones, ur TRACE (*)    Nitrite POSITIVE (*)    All other components within normal limits  URINE CULTURE  POC URINE PREG, ED  CERVICOVAGINAL ANCILLARY ONLY    EKG   Radiology No results found.  Procedures  Procedures (including critical care time)  Medications Ordered in UC Medications - No data to display  Initial  Impression / Assessment and Plan / UC Course  I have reviewed the triage vital signs and the nursing notes.  Pertinent labs & imaging results that were available during my care of the patient were reviewed by me and considered in my medical decision making (see chart for details).   Vaginal discharge.  Anxiety.  Patient obtained vaginal self swab for testing.  Treating with metronidazole.  Instructed patient to abstain from sexual activity for 7 days.  Discussed that if her vaginal test are positive she may require additional treatment at that time and her sexual partners may also require treatment. Counseled patient at length that she should not take other people's prescription medication, especially not a controlled substance such as Xanax. Also discussed that she should not drive while taking such a medication.  Instructed patient to follow-up with her PCP as soon as possible to discuss her anxiety.  Patient agrees to plan of care.   Final Clinical Impressions(s) / UC Diagnoses   Final diagnoses:  Vaginal discharge  Anxiety state     Discharge Instructions     Take metronidazole twice a day for 7 days.    Do not have sex for 7 days.  Your vaginal tests are pending.  If your test results are positive, we will call you.  You may need additional treatment and your partner(s) may also need treatment.       Your pregnancy test is negative.    Follow up with your primary care provider as soon as possible to discuss your anxiety.        ED Prescriptions    Medication Sig Dispense Auth. Provider   metroNIDAZOLE (FLAGYL) 500 MG tablet Take 1 tablet (500 mg total) by mouth 2 (two) times daily. 14 tablet Mickie Bail, NP     PDMP not reviewed this encounter.   Mickie Bail, NP 07/02/20 1616

## 2020-07-02 NOTE — Discharge Instructions (Addendum)
Take metronidazole twice a day for 7 days.    Do not have sex for 7 days.  Your vaginal tests are pending.  If your test results are positive, we will call you.  You may need additional treatment and your partner(s) may also need treatment.       Your pregnancy test is negative.    Follow up with your primary care provider as soon as possible to discuss your anxiety.

## 2020-07-03 LAB — URINE CULTURE: Culture: >=100000 — AB

## 2020-07-04 ENCOUNTER — Telehealth (HOSPITAL_COMMUNITY): Payer: Self-pay | Admitting: Emergency Medicine

## 2020-07-04 LAB — CERVICOVAGINAL ANCILLARY ONLY
Bacterial Vaginitis (gardnerella): POSITIVE — AB
Candida Glabrata: POSITIVE — AB
Candida Vaginitis: NEGATIVE
Chlamydia: NEGATIVE
Comment: NEGATIVE
Comment: NEGATIVE
Comment: NEGATIVE
Comment: NEGATIVE
Comment: NEGATIVE
Comment: NORMAL
Neisseria Gonorrhea: NEGATIVE
Trichomonas: NEGATIVE

## 2020-07-04 MED ORDER — CEPHALEXIN 500 MG PO CAPS
500.0000 mg | ORAL_CAPSULE | Freq: Two times a day (BID) | ORAL | 0 refills | Status: AC
Start: 2020-07-04 — End: 2020-07-11

## 2020-07-04 MED ORDER — FLUCONAZOLE 150 MG PO TABS
150.0000 mg | ORAL_TABLET | Freq: Once | ORAL | 0 refills | Status: AC
Start: 2020-07-04 — End: 2020-07-04

## 2020-07-05 ENCOUNTER — Telehealth (HOSPITAL_COMMUNITY): Payer: Self-pay | Admitting: Emergency Medicine

## 2020-07-05 MED ORDER — METRONIDAZOLE 0.75 % VA GEL: 1.0000 | Freq: Every day | VAGINAL | 0 refills | Status: AC

## 2020-08-28 ENCOUNTER — Other Ambulatory Visit: Payer: Self-pay

## 2020-08-28 ENCOUNTER — Ambulatory Visit (HOSPITAL_COMMUNITY)
Admission: EM | Admit: 2020-08-28 | Discharge: 2020-08-28 | Disposition: A | Discharge status: Home / Self Care | Payer: Medicaid Other | Attending: Family Medicine | Admitting: Family Medicine

## 2020-08-28 ENCOUNTER — Encounter (HOSPITAL_COMMUNITY): Payer: Self-pay

## 2020-08-28 DIAGNOSIS — Z202 Contact with and (suspected) exposure to infections with a predominantly sexual mode of transmission: Secondary | ICD-10-CM | POA: Diagnosis present

## 2020-08-28 MED ORDER — CEFTRIAXONE SODIUM 500 MG IJ SOLR
500.0000 mg | Freq: Once | INTRAMUSCULAR | Status: AC
  Administered 2020-08-28: 500 mg via INTRAMUSCULAR

## 2020-08-28 MED ORDER — CEFTRIAXONE SODIUM 500 MG IJ SOLR
INTRAMUSCULAR | Status: AC
  Filled 2020-08-28: qty 500

## 2020-08-28 MED ORDER — DOXYCYCLINE HYCLATE 100 MG PO TABS
100.0000 mg | ORAL_TABLET | Freq: Two times a day (BID) | ORAL | 0 refills | Status: DC
Start: 2020-08-28 — End: 2021-03-06

## 2020-08-28 MED ORDER — LIDOCAINE HCL (PF) 1 % IJ SOLN
INTRAMUSCULAR | Status: AC
  Filled 2020-08-28: qty 2

## 2020-08-28 NOTE — ED Triage Notes (Signed)
Pt is here wanting STD testing after her partner informed her on Friday that he tested POSITIVE for Chylmdia and Gherrneo, pt last had unprotected sex Thursday with the same partner.

## 2020-08-28 NOTE — ED Provider Notes (Signed)
[facility]    CSN: [id number] Arrival date & time: 08/28/20  1626      History   Chief Complaint Chief Complaint  Patient presents with  . S74.5    HPI Angela Mercado is a 34 y.o. female.   Presenting today for STI testing after receiving a call from her partner stating they tested positive for gonorrhea and chlamydia. She has been having some mild pelvic cramping and thick discharge but otherwise asymptomatic. Requesting treatment given exposures. Partner has already been treated.      Past Medical History:  Diagnosis Date  . Chronic kidney disease   . History of HPV infection   . History of syphilis   . Hx of chlamydia infection   . Hx of gonorrhea     Patient Active Problem List   Diagnosis Date Noted  . Positive GBS test 07/05/2014  . Hx of pre-eclampsia in prior pregnancy, currently pregnant 07/05/2014  . Hx of breast augmentation 2010 07/05/2014  . Vaginal delivery 07/05/2014  . Smoker 07/04/2014  . Rubella non-immune status, antepartum 07/04/2014  . Frequent UTI 05/12/2013    Past Surgical History:  Procedure Laterality Date  . PLACEMENT OF BREAST IMPLANTS    . WISDOM TOOTH EXTRACTION      OB History    Gravida  4   Para  3   Term  3   Preterm      AB  1   Living  3     SAB  1   TAB      Ectopic      Multiple      Live Births  3            Home Medications    Prior to Admission medications   Medication Sig Start Date End Date Taking? Authorizing Provider  benzonatate (TESSALON) 100 MG capsule Take 1 capsule (100 mg total) by mouth every 8 (eight) hours. 05/16/20   McDonald, Mia A, PA-C  doxycycline (VIBRA-TABS) 100 MG tablet Take 1 tablet (100 mg total) by mouth 2 (two) times daily. 08/28/20   Particia Nearing, PA-C  fluconazole (DIFLUCAN) 150 MG tablet Take 150 mg by mouth once. 07/04/20   [provider]  loperamide (IMODIUM) 2 MG capsule Take 1 capsule (2 mg total) by mouth 4 (four) times daily as  needed for diarrhea or loose stools. 05/16/20   McDonald, Mia A, PA-C  ondansetron (ZOFRAN ODT) 4 MG disintegrating tablet Take 1 tablet (4 mg total) by mouth every 8 (eight) hours as needed. 05/16/20   McDonald, Mia A, PA-C    Family History Family History  Problem Relation Age of Onset  . COPD Father     Social History Social History   Tobacco Use  . Smoking status: Current Every Day Smoker    Types: Cigarettes    Last attempt to quit: 06/05/2013    Years since quitting: 7.2  . Smokeless tobacco: Never Used  Vaping Use  . Vaping Use: Never used  Substance Use Topics  . Alcohol use: Yes    Comment: daily  . Drug use: No     Allergies   Oxycodone-acetaminophen   Review of Systems Review of Systems PER HPI    Physical Exam Triage Vital Signs ED Triage Vitals  Enc Vitals Group     BP 08/28/20 1657 (!) 145/96     Pulse Rate 08/28/20 1657 83     Resp 08/28/20 1657 16     Temp  08/28/20 1657 98 F (36.7 C)     Temp Source 08/28/20 1657 Oral     SpO2 08/28/20 1657 98 %     Weight --      Height --      Head Circumference --      Peak Flow --      Pain Score 08/28/20 1655 0     Pain Loc --      Pain Edu? --      Excl. in GC? --    No data found.  Updated Vital Signs BP (!) 145/96 (BP Location: Right Arm)   Pulse 83   Temp 98 F (36.7 C) (Oral)   Resp 16   LMP 08/16/2020   SpO2 98%   Breastfeeding No   Visual Acuity Right Eye Distance:   Left Eye Distance:   Bilateral Distance:    Right Eye Near:   Left Eye Near:    Bilateral Near:     Physical Exam Vitals and nursing note reviewed.  Constitutional:      Appearance: Normal appearance. She is not ill-appearing.  HENT:     Head: Atraumatic.  Eyes:     Extraocular Movements: Extraocular movements intact.     Conjunctiva/sclera: Conjunctivae normal.  Cardiovascular:     Rate and Rhythm: Normal rate and regular rhythm.     Heart sounds: Normal heart sounds.  Pulmonary:     Effort: Pulmonary  effort is normal.     Breath sounds: Normal breath sounds.  Abdominal:     General: Bowel sounds are normal. There is no distension.     Palpations: Abdomen is soft.     Tenderness: There is no abdominal tenderness. There is no guarding.  Genitourinary:    Comments: GU exam deferred, self swab performed Musculoskeletal:        General: Normal range of motion.     Cervical back: Normal range of motion and neck supple.  Skin:    General: Skin is warm and dry.  Neurological:     Mental Status: She is alert and oriented to person, place, and time.  Psychiatric:        Mood and Affect: Mood normal.        Thought Content: Thought content normal.        Judgment: Judgment normal.      UC Treatments / Results  Labs (all labs ordered are listed, but only abnormal results are displayed) Labs Reviewed  CERVICOVAGINAL ANCILLARY ONLY    EKG   Radiology No results found.  Procedures Procedures (including critical care time)  Medications Ordered in UC Medications  cefTRIAXone (ROCEPHIN) injection 500 mg (has no administration in time range)    Initial Impression / Assessment and Plan / UC Course  I have reviewed the triage vital signs and the nursing notes.  Pertinent labs & imaging results that were available during my care of the patient were reviewed by me and considered in my medical decision making (see chart for details).     Aptima swab pending, will treat with rocephin and doxy given her known exposures while awaiting test results. Safe sexual practices reviewed. F/u if sxs worsening or not resolving.   Final Clinical Impressions(s) / UC Diagnoses   Final diagnoses:  Exposure to STD   Discharge Instructions   None    ED Prescriptions    Medication Sig Dispense Auth. Provider   doxycycline (VIBRA-TABS) 100 MG tablet Take 1 tablet (100 mg total) by mouth 2 (  two) times daily. 14 tablet Particia Nearing, [place]     PDMP not reviewed this encounter.     Particia Nearing, [place] 08/28/20 1814

## 2020-08-30 ENCOUNTER — Telehealth (HOSPITAL_COMMUNITY): Payer: Self-pay | Admitting: Emergency Medicine

## 2020-08-30 LAB — CERVICOVAGINAL ANCILLARY ONLY
Bacterial Vaginitis (gardnerella): POSITIVE — AB
Candida Glabrata: NEGATIVE
Candida Vaginitis: POSITIVE — AB
Chlamydia: POSITIVE — AB
Comment: NEGATIVE
Comment: NEGATIVE
Comment: NEGATIVE
Comment: NEGATIVE
Comment: NEGATIVE
Comment: NORMAL
Neisseria Gonorrhea: NEGATIVE
Trichomonas: NEGATIVE

## 2020-08-30 MED ORDER — FLUCONAZOLE 150 MG PO TABS
150.0000 mg | ORAL_TABLET | Freq: Once | ORAL | 0 refills | Status: AC
Start: 2020-08-30 — End: 2020-08-30

## 2020-08-30 MED ORDER — METRONIDAZOLE 0.75 % VA GEL: 1.0000 | Freq: Every day | VAGINAL | 0 refills | Status: AC

## 2020-11-23 ENCOUNTER — Other Ambulatory Visit: Payer: Self-pay

## 2020-11-23 ENCOUNTER — Emergency Department (HOSPITAL_COMMUNITY)
Admission: EM | Admit: 2020-11-23 | Discharge: 2020-11-23 | Disposition: A | Discharge status: Home / Self Care | Payer: Medicaid Other | Attending: Emergency Medicine | Admitting: Emergency Medicine

## 2020-11-23 ENCOUNTER — Emergency Department (HOSPITAL_COMMUNITY): Payer: Medicaid Other

## 2020-11-23 ENCOUNTER — Encounter (HOSPITAL_COMMUNITY): Payer: Self-pay | Admitting: Emergency Medicine

## 2020-11-23 DIAGNOSIS — O99331 Smoking (tobacco) complicating pregnancy, first trimester: Secondary | ICD-10-CM | POA: Diagnosis not present

## 2020-11-23 DIAGNOSIS — N189 Chronic kidney disease, unspecified: Secondary | ICD-10-CM | POA: Insufficient documentation

## 2020-11-23 DIAGNOSIS — R1084 Generalized abdominal pain: Secondary | ICD-10-CM | POA: Diagnosis not present

## 2020-11-23 DIAGNOSIS — O26831 Pregnancy related renal disease, first trimester: Secondary | ICD-10-CM | POA: Insufficient documentation

## 2020-11-23 DIAGNOSIS — R52 Pain, unspecified: Secondary | ICD-10-CM

## 2020-11-23 DIAGNOSIS — Z3A10 10 weeks gestation of pregnancy: Secondary | ICD-10-CM | POA: Insufficient documentation

## 2020-11-23 DIAGNOSIS — O219 Vomiting of pregnancy, unspecified: Secondary | ICD-10-CM | POA: Insufficient documentation

## 2020-11-23 DIAGNOSIS — O26891 Other specified pregnancy related conditions, first trimester: Secondary | ICD-10-CM | POA: Insufficient documentation

## 2020-11-23 DIAGNOSIS — F17210 Nicotine dependence, cigarettes, uncomplicated: Secondary | ICD-10-CM | POA: Diagnosis not present

## 2020-11-23 DIAGNOSIS — Z3491 Encounter for supervision of normal pregnancy, unspecified, first trimester: Secondary | ICD-10-CM

## 2020-11-23 LAB — WET PREP, GENITAL
Clue Cells Wet Prep HPF POC: NONE SEEN
Sperm: NONE SEEN
Trich, Wet Prep: NONE SEEN
Yeast Wet Prep HPF POC: NONE SEEN

## 2020-11-23 LAB — COMPREHENSIVE METABOLIC PANEL
ALT: 14 U/L (ref 0–44)
AST: 22 U/L (ref 15–41)
Albumin: 4.1 g/dL (ref 3.5–5.0)
Alkaline Phosphatase: 54 U/L (ref 38–126)
Anion gap: 9 (ref 5–15)
BUN: 10 mg/dL (ref 6–20)
CO2: 24 mmol/L (ref 22–32)
Calcium: 9.3 mg/dL (ref 8.9–10.3)
Chloride: 103 mmol/L (ref 98–111)
Creatinine, Ser: 0.69 mg/dL (ref 0.44–1.00)
GFR, Estimated: >60 mL/min (ref >60)
Glucose, Bld: 85 mg/dL (ref 70–99)
Potassium: 4.1 mmol/L (ref 3.5–5.1)
Sodium: 136 mmol/L (ref 135–145)
Total Bilirubin: 0.6 mg/dL (ref 0.3–1.2)
Total Protein: 7.5 g/dL (ref 6.5–8.1)

## 2020-11-23 LAB — CBC
HCT: 35.9 % — ABNORMAL LOW (ref 36.0–46.0)
Hemoglobin: 12.4 g/dL (ref 12.0–15.0)
MCH: 33.9 pg (ref 26.0–34.0)
MCHC: 34.5 g/dL (ref 30.0–36.0)
MCV: 98.1 fL (ref 80.0–100.0)
Platelets: 306 10*3/uL (ref 150–400)
RBC: 3.66 MIL/uL — ABNORMAL LOW (ref 3.87–5.11)
RDW: 12.1 % (ref 11.5–15.5)
WBC: 9.8 10*3/uL (ref 4.0–10.5)
nRBC: 0 % (ref 0.0–0.2)

## 2020-11-23 LAB — URINALYSIS, ROUTINE W REFLEX MICROSCOPIC
Bilirubin Urine: NEGATIVE
Glucose, UA: NEGATIVE mg/dL
Hgb urine dipstick: NEGATIVE
Ketones, ur: 5 mg/dL — AB
Leukocytes,Ua: NEGATIVE
Nitrite: NEGATIVE
Protein, ur: 30 mg/dL — AB
Specific Gravity, Urine: 1.02 (ref 1.005–1.030)
pH: 7 (ref 5.0–8.0)

## 2020-11-23 LAB — I-STAT BETA HCG BLOOD, ED (MC, WL, AP ONLY): I-stat hCG, quantitative: >2000 m[IU]/mL — ABNORMAL HIGH (ref <5)

## 2020-11-23 LAB — LIPASE, BLOOD: Lipase: 28 U/L (ref 11–51)

## 2020-11-23 LAB — HIV ANTIBODY (ROUTINE TESTING W REFLEX): HIV Screen 4th Generation wRfx: NONREACTIVE

## 2020-11-23 LAB — HCG, QUANTITATIVE, PREGNANCY: hCG, Beta Chain, Quant, S: 294712 m[IU]/mL — ABNORMAL HIGH (ref <5)

## 2020-11-23 MED ORDER — SODIUM CHLORIDE 0.9 % IV BOLUS
1000.0000 mL | Freq: Once | INTRAVENOUS | Status: AC
  Administered 2020-11-23: 1000 mL via INTRAVENOUS

## 2020-11-23 MED ORDER — ONDANSETRON HCL 4 MG/2ML IJ SOLN
4.0000 mg | Freq: Once | INTRAMUSCULAR | Status: AC
  Administered 2020-11-23: 4 mg via INTRAVENOUS
  Filled 2020-11-23: qty 2

## 2020-11-23 MED ORDER — ACETAMINOPHEN 325 MG PO TABS
650.0000 mg | ORAL_TABLET | Freq: Once | ORAL | Status: AC
  Administered 2020-11-23: 650 mg via ORAL
  Filled 2020-11-23: qty 2

## 2020-11-23 MED ORDER — MORPHINE SULFATE (PF) 4 MG/ML IV SOLN
4.0000 mg | Freq: Once | INTRAVENOUS | Status: AC
Start: 2020-11-23 — End: 2020-11-23
  Administered 2020-11-23: 4 mg via INTRAVENOUS
  Filled 2020-11-23: qty 1

## 2020-11-23 MED ORDER — METOCLOPRAMIDE HCL 5 MG/ML IJ SOLN
10.0000 mg | Freq: Once | INTRAMUSCULAR | Status: AC
  Administered 2020-11-23: 10 mg via INTRAVENOUS
  Filled 2020-11-23: qty 2

## 2020-11-23 NOTE — ED Provider Notes (Signed)
[facility]-EMERGENCY DEPT Provider Note   CSN: [id number] Arrival date & time: 11/23/20  1237     History Chief Complaint  Patient presents with  . Abdominal Pain    Angela Mercado is a 35 y.o. female.  The history is provided by the patient and medical records.  Abdominal Pain  Angela Mercado is a 35 y.o. female who presents to the Emergency Department complaining of abdominal pain. She is a G5 P4014 presenting to the emergency department for evaluation of intermittent generalized abdominal pain for the last few weeks to months.   Pain is described as hurting.  At times pain is so bad she starts vomiting.  This current episode started this morning.  Last episode was two days ago.  Has associated non-bloody diarrhea.  Vomited twice today. Vomits daily for six months. LMP 12/9. She recently found out she was pregnant in the last 1 to 2 weeks. No prenatal care to this point. Her most recent pregnancy was a tubal pregnancy treated with surgery.   Quit smoking three weeks ago.  Drinks one drink daily jameson red bull.  Uses marijuana and delta 8 CBD.      Past Medical History:  Diagnosis Date  . Chronic kidney disease   . History of HPV infection   . History of syphilis   . Hx of chlamydia infection   . Hx of gonorrhea     Patient Active Problem List   Diagnosis Date Noted  . Positive GBS test 07/05/2014  . Hx of pre-eclampsia in prior pregnancy, currently pregnant 07/05/2014  . Hx of breast augmentation 2010 07/05/2014  . Vaginal delivery 07/05/2014  . Smoker 07/04/2014  . Rubella non-immune status, antepartum 07/04/2014  . Frequent UTI 05/12/2013    Past Surgical History:  Procedure Laterality Date  . PLACEMENT OF BREAST IMPLANTS    . WISDOM TOOTH EXTRACTION       OB History    Gravida  4   Para  3   Term  3   Preterm      AB  1   Living  3     SAB  1   IAB      Ectopic      Multiple      Live Births  3           Family  History  Problem Relation Age of Onset  . COPD Father     Social History   Tobacco Use  . Smoking status: Current Every Day Smoker    Types: Cigarettes    Last attempt to quit: 06/05/2013    Years since quitting: 7.4  . Smokeless tobacco: Never Used  Vaping Use  . Vaping Use: Never used  Substance Use Topics  . Alcohol use: Yes    Comment: daily  . Drug use: No    Home Medications Prior to Admission medications   Medication Sig Start Date End Date Taking? Authorizing Provider  benzonatate (TESSALON) 100 MG capsule Take 1 capsule (100 mg total) by mouth every 8 (eight) hours. Patient not taking: Reported on 11/23/2020 05/16/20   McDonald, Mia A, PA-C  doxycycline (VIBRA-TABS) 100 MG tablet Take 1 tablet (100 mg total) by mouth 2 (two) times daily. Patient not taking: Reported on 11/23/2020 08/28/20   Particia Nearing, PA-C  loperamide (IMODIUM) 2 MG capsule Take 1 capsule (2 mg total) by mouth 4 (four) times daily as needed for diarrhea or loose stools. Patient not taking: Reported  on 11/23/2020 05/16/20   McDonald, Pedro Earls A, PA-C  ondansetron (ZOFRAN ODT) 4 MG disintegrating tablet Take 1 tablet (4 mg total) by mouth every 8 (eight) hours as needed. Patient not taking: Reported on 11/23/2020 05/16/20   Frederik Pear A, PA-C    Allergies    Oxycodone-acetaminophen  Review of Systems   Review of Systems  Gastrointestinal: Positive for abdominal pain.  All other systems reviewed and are negative.   Physical Exam Updated Vital Signs BP 115/82   Pulse 77   Temp 98 F (36.7 C) (Oral)   Resp 18   LMP 09/12/2020   SpO2 100%   Physical Exam Vitals and nursing note reviewed.  Constitutional:      Appearance: She is well-developed and well-nourished.  HENT:     Head: Normocephalic and atraumatic.  Cardiovascular:     Rate and Rhythm: Normal rate and regular rhythm.     Heart sounds: No murmur heard.   Pulmonary:     Effort: Pulmonary effort is normal. No respiratory  distress.     Breath sounds: Normal breath sounds.  Abdominal:     Palpations: Abdomen is soft.     Tenderness: There is no guarding or rebound.     Comments: Mild RLQ tenderness  Genitourinary:    Comments: No CMT. Physiologic vaginal discharge. Musculoskeletal:        General: No tenderness or edema.  Skin:    General: Skin is warm and dry.  Neurological:     Mental Status: She is alert and oriented to person, place, and time.  Psychiatric:        Mood and Affect: Mood and affect normal.        Behavior: Behavior normal.     ED Results / Procedures / Treatments   Labs (all labs ordered are listed, but only abnormal results are displayed) Labs Reviewed  WET PREP, GENITAL - Abnormal; Notable for the following components:      Result Value   WBC, Wet Prep HPF POC FEW (*)    All other components within normal limits  CBC - Abnormal; Notable for the following components:   RBC 3.66 (*)    HCT 35.9 (*)    All other components within normal limits  URINALYSIS, ROUTINE W REFLEX MICROSCOPIC - Abnormal; Notable for the following components:   Ketones, ur 5 (*)    Protein, ur 30 (*)    Bacteria, UA RARE (*)    All other components within normal limits  HCG, QUANTITATIVE, PREGNANCY - Abnormal; Notable for the following components:   hCG, Beta Chain, [place]alia Longest 222,979 (*)    All other components within normal limits  I-STAT BETA HCG BLOOD, ED ([facility], [facility], [facility] ONLY) - Abnormal; Notable for the following components:   I-stat hCG, quantitative >2,000.0 (*)    All other components within normal limits  URINE CULTURE  LIPASE, BLOOD  COMPREHENSIVE METABOLIC PANEL  HIV ANTIBODY (ROUTINE TESTING W REFLEX)  RPR  GC/CHLAMYDIA PROBE AMP ([place]) NOT AT [facility]    EKG None  Radiology US OB LESS THAN 14 WEEKS WITH OB TRANSVAGINAL  Result Date: 11/23/2020 CLINICAL DATA:  First trimester pregnancy, abdominal pain, history of RIGHT-sided ectopic pregnancy 5 years ago with subsequent  salpingectomy EXAM: OBSTETRIC <14 WK [place] AND TRANSVAGINAL OB US TECHNIQUE: Both transabdominal and transvaginal ultrasound examinations were performed for complete evaluation of the gestation as well as the maternal uterus, adnexal regions, and pelvic cul-de-sac. Transvaginal technique was performed to assess  early pregnancy. COMPARISON:  None for this gestation FINDINGS: Intrauterine gestational sac: Present, single Yolk sac:  Present Embryo:  Present Cardiac Activity: Present Heart Rate: 164 bpm CRL:  25 mm   9 w   2 d                  [place] EDC: 06/26/2021 Subchorionic hemorrhage:  None visualized. Maternal uterus/adnexae: Uterus anteverted with a small posterior upper uterine segment leiomyoma subserosal 2.0 x 1.5 x 2.0 cm. LEFT ovary normal size and morphology, 2.1 x 1.9 x 2.8 cm. RIGHT ovary not visualized. No free pelvic fluid or adnexal masses. IMPRESSION: Single live intrauterine gestation at 9 weeks 2 days EGA by crown-rump length. Small subserosal leiomyoma at posterior upper uterus 2.0 cm diameter. Nonvisualization of RIGHT ovary. Electronically Signed   By: Ulyses Southward M.D.   On: 11/23/2020 16:17    Procedures Procedures   Medications Ordered in ED Medications  sodium chloride 0.9 % bolus 1,000 mL (0 mLs Intravenous Stopped 11/23/20 1437)  ondansetron (ZOFRAN) injection 4 mg (4 mg Intravenous Given 11/23/20 1436)  morphine 4 MG/ML injection 4 mg (4 mg Intravenous Given 11/23/20 1436)  acetaminophen (TYLENOL) tablet 650 mg (650 mg Oral Given 11/23/20 1741)  metoCLOPramide (REGLAN) injection 10 mg (10 mg Intravenous Given 11/23/20 1741)    ED Course  I have reviewed the triage vital signs and the nursing notes.  Pertinent labs & imaging results that were available during my care of the patient were reviewed by me and considered in my medical decision making (see chart for details).    MDM Rules/Calculators/A&P                         patient is a G5 P4014 approximately 10 weeks based off of  LMP here for evaluation of recurrent abdominal pain, vomiting and intermittent stools every three days. She has tenderness on examination without peritoneal findings. Pelvic examination is not consistent with the service dinosaur PID. Pelvic ultrasound demonstrates IUP at nine weeks and two days. UA is not consistent with UTI. Examination presentation is not consistent with serious intra-abdominal infection or sepsis. Discussed with patient home care for abdominal pain and pregnancy. Discussed home care for nausea and vomiting, OB/GYN follow-up and return precautions.  Final Clinical Impression(s) / ED Diagnoses Final diagnoses:  Generalized abdominal pain  First trimester pregnancy    Rx / DC Orders ED Discharge Orders    None       Tilden Fossa, MD 11/23/20 2203

## 2020-11-23 NOTE — ED Triage Notes (Signed)
Patient reports generalized abdominal pain with N/V/D x2 days. States pregnant and LMP 12/9. Has not yet seen OB. Denies vaginal bleeding.

## 2020-11-23 NOTE — ED Notes (Signed)
Pt in bed on phone, respirations even and unlabored. Denies any needs at this time.

## 2020-11-24 LAB — RPR
RPR Ser Ql: REACTIVE — AB
RPR Titer: 1:1 {titer}

## 2020-11-25 LAB — URINE CULTURE: Culture: >=100000 — AB

## 2020-11-25 LAB — GC/CHLAMYDIA PROBE AMP (~~LOC~~) NOT AT ARMC
Chlamydia: NEGATIVE
Comment: NEGATIVE
Comment: NORMAL
Neisseria Gonorrhea: NEGATIVE

## 2020-11-25 LAB — T.PALLIDUM AB, TOTAL: T Pallidum Abs: NONREACTIVE

## 2020-11-26 ENCOUNTER — Telehealth: Payer: Self-pay | Admitting: Emergency Medicine

## 2020-11-26 NOTE — Progress Notes (Signed)
ED Antimicrobial Stewardship Positive Culture Follow Up   Angela Mercado is an 35 y.o. female who presented to [facility] on 11/23/2020 with a chief complaint of  Chief Complaint  Patient presents with  . Abdominal Pain    Recent Results (from the past 720 hour(s))  Wet prep, genital     Status: Abnormal   Collection Time: 11/23/20  3:02 PM  Result Value Ref Range Status   Yeast Wet Prep HPF POC NONE SEEN NONE SEEN Final   Trich, Wet Prep NONE SEEN NONE SEEN Final   Clue Cells Wet Prep HPF POC NONE SEEN NONE SEEN Final   WBC, Wet Prep HPF POC FEW (A) NONE SEEN Final   Sperm NONE SEEN  Final    Comment: Performed at [facility], [address], [place], [place] [address]  Urine culture     Status: Abnormal   Collection Time: 11/23/20  4:25 PM   Specimen: Urine, Random  Result Value Ref Range Status   Specimen Description   Final    URINE, RANDOM Performed at [facility], [address] [place], [place] [address]    Special Requests   Final    NONE Performed at [facility], [address] [place], [place] [address]    Culture >=100,000 COLONIES/mL VIRIDANS STREPTOCOCCUS (A)  Final   Report Status 11/25/2020 FINAL  Final    [x]  Patient discharged originally without antimicrobial agent and treatment is now indicated  Pregnant patient now with >100,000 colonies of viridans strep  New antibiotic prescription: Amoxicillin 500mg  TID x3 days   ED Provider: PA-C   11/26/2020, 8:49 AM Clinical Pharmacist Monday - Friday phone -  [phone] Saturday - Sunday phone - [phone]

## 2020-11-26 NOTE — Telephone Encounter (Signed)
Post ED Visit - Positive Culture Follow-up: Successful Patient Follow-Up  Culture assessed and recommendations reviewed by:  []  , Pharm.D. []  Enzo Bi, Pharm.D., BCPS AQ-ID []  , Pharm.D., BCPS []  Celedonio Miyamoto, Pharm.D., BCPS []  [place], Garvin Fila.D., BCPS, AAHIVP []  , Pharm.D., BCPS, AAHIVP []  Georgina Pillion, PharmD, BCPS []  , PharmD, BCPS []  [place], PharmD, BCPS []  [address], PharmD  Positive urine culture  [x]  Patient discharged without antimicrobial prescription and treatment is now indicated []  Organism is resistant to prescribed ED discharge antimicrobial []  Patient with positive blood cultures  Changes discussed with ED provider: PA New antibiotic prescription start Amoxicillin 500mg  po tid x 3 days  Attempting to contact patient   Estella Husk 11/26/2020, 10:37 AM

## 2020-11-28 ENCOUNTER — Telehealth: Payer: Self-pay | Admitting: Emergency Medicine

## 2020-12-05 ENCOUNTER — Telehealth: Payer: Self-pay | Admitting: *Deleted

## 2020-12-05 NOTE — Telephone Encounter (Signed)
Post ED Visit - Positive Culture Follow-up: Successful Patient Follow-Up  Culture assessed and recommendations reviewed by:  []  , Pharm.D. []  Enzo Bi, Pharm.D., BCPS AQ-ID []  , Pharm.D., BCPS []  Celedonio Miyamoto, Pharm.D., BCPS []  [place], Garvin Fila.D., BCPS, AAHIVP []  , Pharm.D., BCPS, AAHIVP []  Georgina Pillion, PharmD, BCPS []  , PharmD, BCPS []  [place], PharmD, BCPS []  [address], PharmD  Positive URINE culture  [x]  Patient discharged without antimicrobial prescription and treatment is now indicated []  Organism is resistant to prescribed ED discharge antimicrobial []  Patient with positive blood cultures  Changes discussed with ED provider , PAC* New antibiotic prescription AMOXICILLIN 500MG  PO TID X 3 DAYS Called to [facility] [address] [place] [phone]  Contacted patient, date 12/05/2020, time 1240   Lysle Pearl 12/05/2020, 12:40 PM

## 2021-01-29 ENCOUNTER — Ambulatory Visit (HOSPITAL_COMMUNITY)
Admission: EM | Admit: 2021-01-29 | Discharge: 2021-01-29 | Disposition: A | Discharge status: Home / Self Care | Payer: Medicaid Other | Attending: Emergency Medicine | Admitting: Emergency Medicine

## 2021-01-29 ENCOUNTER — Other Ambulatory Visit: Payer: Self-pay

## 2021-01-29 ENCOUNTER — Encounter (HOSPITAL_COMMUNITY): Payer: Self-pay

## 2021-01-29 DIAGNOSIS — N898 Other specified noninflammatory disorders of vagina: Secondary | ICD-10-CM | POA: Diagnosis present

## 2021-01-29 DIAGNOSIS — N3001 Acute cystitis with hematuria: Secondary | ICD-10-CM | POA: Diagnosis not present

## 2021-01-29 DIAGNOSIS — Z113 Encounter for screening for infections with a predominantly sexual mode of transmission: Secondary | ICD-10-CM | POA: Diagnosis not present

## 2021-01-29 LAB — POCT URINALYSIS DIPSTICK, ED / UC
Bilirubin Urine: NEGATIVE
Glucose, UA: NEGATIVE mg/dL
Ketones, ur: NEGATIVE mg/dL
Nitrite: NEGATIVE
Protein, ur: 100 mg/dL — AB
Specific Gravity, Urine: 1.015 (ref 1.005–1.030)
Urobilinogen, UA: 0.2 mg/dL (ref 0.0–1.0)
pH: 7 (ref 5.0–8.0)

## 2021-01-29 MED ORDER — CEPHALEXIN 500 MG PO CAPS: 500.0000 mg | ORAL_CAPSULE | Freq: Two times a day (BID) | ORAL | 0 refills | Status: AC

## 2021-01-29 NOTE — ED Provider Notes (Signed)
[facility]    CSN: [id number] Arrival date & time: 01/29/21  1809      History   Chief Complaint Chief Complaint  Patient presents with  . Vaginal Discharge  . Dysuria    HPI Angela Mercado is a 35 y.o. female.   Patient here for evaluation of dysuria, urgency, and frequency that has been ongoing for the past 2 to 3 days.  Also reports having some vaginal discharge.  Reports just finished menstrual cycle.  Has not taken any OTC medications or treatments. Denies any trauma, injury, or other precipitating event.  Denies any specific alleviating or aggravating factors.  Denies any fevers, chest pain, shortness of breath, N/V/D, numbness, tingling, weakness, abdominal pain, or headaches.   ROS: As per HPI, all other pertinent ROS negative   The history is provided by the patient.    Past Medical History:  Diagnosis Date  . Chronic kidney disease   . History of HPV infection   . History of syphilis   . Hx of chlamydia infection   . Hx of gonorrhea     Patient Active Problem List   Diagnosis Date Noted  . Positive GBS test 07/05/2014  . Hx of pre-eclampsia in prior pregnancy, currently pregnant 07/05/2014  . Hx of breast augmentation 2010 07/05/2014  . Vaginal delivery 07/05/2014  . Smoker 07/04/2014  . Rubella non-immune status, antepartum 07/04/2014  . Frequent UTI 05/12/2013    Past Surgical History:  Procedure Laterality Date  . PLACEMENT OF BREAST IMPLANTS    . WISDOM TOOTH EXTRACTION      OB History    Gravida  4   Para  3   Term  3   Preterm      AB  1   Living  3     SAB  1   IAB      Ectopic      Multiple      Live Births  3            Home Medications    Prior to Admission medications   Medication Sig Start Date End Date Taking? Authorizing Provider  cephALEXin (KEFLEX) 500 MG capsule Take 1 capsule (500 mg total) by mouth 2 (two) times daily for 7 days. 01/29/21 02/05/21 Yes Ivette Loyal, NP  benzonatate (TESSALON)  100 MG capsule Take 1 capsule (100 mg total) by mouth every 8 (eight) hours. Patient not taking: No sig reported 05/16/20   McDonald, Mia A, PA-C  doxycycline (VIBRA-TABS) 100 MG tablet Take 1 tablet (100 mg total) by mouth 2 (two) times daily. Patient not taking: No sig reported 08/28/20   Particia Nearing, PA-C  loperamide (IMODIUM) 2 MG capsule Take 1 capsule (2 mg total) by mouth 4 (four) times daily as needed for diarrhea or loose stools. Patient not taking: No sig reported 05/16/20   McDonald, Mia A, PA-C  ondansetron (ZOFRAN ODT) 4 MG disintegrating tablet Take 1 tablet (4 mg total) by mouth every 8 (eight) hours as needed. Patient not taking: No sig reported 05/16/20   Frederik Pear A, PA-C    Family History Family History  Problem Relation Age of Onset  . COPD Father     Social History Social History   Tobacco Use  . Smoking status: Current Every Day Smoker    Types: Cigarettes    Last attempt to quit: 06/05/2013    Years since quitting: 7.6  . Smokeless tobacco: Never Used  Vaping Use  .  Vaping Use: Never used  Substance Use Topics  . Alcohol use: Yes    Comment: daily  . Drug use: No     Allergies   Oxycodone-acetaminophen   Review of Systems Review of Systems  Genitourinary: Positive for dysuria, frequency, urgency and vaginal discharge.  All other systems reviewed and are negative.    Physical Exam Triage Vital Signs ED Triage Vitals  Enc Vitals Group     BP 01/29/21 1936 130/85     Pulse Rate 01/29/21 1936 67     Resp 01/29/21 1936 16     Temp 01/29/21 1936 97.9 F (36.6 C)     Temp Source 01/29/21 1936 Oral     SpO2 01/29/21 1936 100 %     Weight --      Height --      Head Circumference --      Peak Flow --      Pain Score 01/29/21 1934 0     Pain Loc --      Pain Edu? --      Excl. in GC? --    No data found.  Updated Vital Signs BP 130/85 (BP Location: Left Arm)   Pulse 67   Temp 97.9 F (36.6 C) (Oral)   Resp 16   LMP  01/26/2021 (Exact Date)   SpO2 100%   Visual Acuity Right Eye Distance:   Left Eye Distance:   Bilateral Distance:    Right Eye Near:   Left Eye Near:    Bilateral Near:     Physical Exam Vitals and nursing note reviewed.  Constitutional:      General: She is not in acute distress.    Appearance: Normal appearance. She is not ill-appearing, toxic-appearing or diaphoretic.  HENT:     Head: Normocephalic and atraumatic.  Eyes:     Conjunctiva/sclera: Conjunctivae normal.  Cardiovascular:     Rate and Rhythm: Normal rate.     Pulses: Normal pulses.  Pulmonary:     Effort: Pulmonary effort is normal.  Abdominal:     General: Abdomen is flat.  Genitourinary:    Comments: declines Musculoskeletal:        General: Normal range of motion.     Cervical back: Normal range of motion.  Skin:    General: Skin is warm and dry.  Neurological:     General: No focal deficit present.     Mental Status: She is alert and oriented to person, place, and time.  Psychiatric:        Mood and Affect: Mood normal.      UC Treatments / Results  Labs (all labs ordered are listed, but only abnormal results are displayed) Labs Reviewed  POCT URINALYSIS DIPSTICK, ED / UC - Abnormal; Notable for the following components:      Result Value   Hgb urine dipstick LARGE (*)    Protein, ur 100 (*)    Leukocytes,Ua LARGE (*)    All other components within normal limits  URINE CULTURE  CERVICOVAGINAL ANCILLARY ONLY    EKG   Radiology No results found.  Procedures Procedures (including critical care time)  Medications Ordered in UC Medications - No data to display  Initial Impression / Assessment and Plan / UC Course  I have reviewed the triage vital signs and the nursing notes.  Pertinent labs & imaging results that were available during my care of the patient were reviewed by me and considered in my medical decision making (see  chart for details).    Assessment negative for red  flags or concerns.  Urinalysis positive for leukocytes, hemoglobin and protein.  Urine culture pending.  Based on patient's symptoms we will treat with Keflex twice daily x7 days.  Encourage fluids and rest.  Self swab also obtained and will treat based on results.  Safe sex practices including condoms and other barrier methods discussed.  Follow-up with primary care as needed.  Final Clinical Impressions(s) / UC Diagnoses   Final diagnoses:  Acute cystitis with hematuria  Vaginal discharge  Screen for STD (sexually transmitted disease)     Discharge Instructions     Take the Keflex twice a day for the next 7 days.   Drink plenty of water.   You can take pyridium, AZO, or AZO cranberry as needed for symptom management.   We will contact you with the results from your lab work and any additional treatment.    Do not have sex while taking undergoing treatment for STI.  Make sure that all of your partners get tested and treated.   Use a condom or other barrier method for all sexual encounters.    Return or go to the Emergency Department if symptoms worsen or do not improve in the next few days.      ED Prescriptions    Medication Sig Dispense Auth. Provider   cephALEXin (KEFLEX) 500 MG capsule Take 1 capsule (500 mg total) by mouth 2 (two) times daily for 7 days. 14 capsule Ivette Loyal, NP     PDMP not reviewed this encounter.   Ivette Loyal, NP 01/29/21 2000

## 2021-01-29 NOTE — ED Triage Notes (Signed)
Pt reports burning sensation when urinating, vaginal discharge x 2-3 days.   Pt states Bactrim is "not strong enough" to treat UTI's on her.   Pt requested STD's testing.

## 2021-01-29 NOTE — Discharge Instructions (Signed)
Take the Keflex twice a day for the next 7 days.   Drink plenty of water.   You can take pyridium, AZO, or AZO cranberry as needed for symptom management.   We will contact you with the results from your lab work and any additional treatment.    Do not have sex while taking undergoing treatment for STI.  Make sure that all of your partners get tested and treated.   Use a condom or other barrier method for all sexual encounters.    Return or go to the Emergency Department if symptoms worsen or do not improve in the next few days.

## 2021-01-30 ENCOUNTER — Telehealth: Payer: Self-pay

## 2021-01-30 LAB — CERVICOVAGINAL ANCILLARY ONLY
Bacterial Vaginitis (gardnerella): POSITIVE — AB
Candida Glabrata: NEGATIVE
Candida Vaginitis: NEGATIVE
Chlamydia: NEGATIVE
Comment: NEGATIVE
Comment: NEGATIVE
Comment: NEGATIVE
Comment: NEGATIVE
Comment: NEGATIVE
Comment: NORMAL
Neisseria Gonorrhea: NEGATIVE
Trichomonas: NEGATIVE

## 2021-01-30 MED ORDER — METRONIDAZOLE 500 MG PO TABS
500.0000 mg | ORAL_TABLET | Freq: Two times a day (BID) | ORAL | 0 refills | Status: DC
Start: 2021-01-30 — End: 2021-06-18

## 2021-01-31 LAB — URINE CULTURE: Culture: <10000 — AB

## 2021-03-06 ENCOUNTER — Encounter (HOSPITAL_COMMUNITY): Payer: Self-pay

## 2021-03-06 ENCOUNTER — Ambulatory Visit (HOSPITAL_COMMUNITY)
Admission: EM | Admit: 2021-03-06 | Discharge: 2021-03-06 | Disposition: A | Discharge status: Home / Self Care | Payer: Medicaid Other | Attending: Urgent Care | Admitting: Urgent Care

## 2021-03-06 ENCOUNTER — Other Ambulatory Visit: Payer: Self-pay

## 2021-03-06 DIAGNOSIS — F17210 Nicotine dependence, cigarettes, uncomplicated: Secondary | ICD-10-CM | POA: Diagnosis not present

## 2021-03-06 DIAGNOSIS — R1110 Vomiting, unspecified: Secondary | ICD-10-CM | POA: Diagnosis not present

## 2021-03-06 DIAGNOSIS — N3001 Acute cystitis with hematuria: Secondary | ICD-10-CM | POA: Insufficient documentation

## 2021-03-06 DIAGNOSIS — Z87440 Personal history of urinary (tract) infections: Secondary | ICD-10-CM | POA: Insufficient documentation

## 2021-03-06 DIAGNOSIS — N390 Urinary tract infection, site not specified: Secondary | ICD-10-CM | POA: Diagnosis not present

## 2021-03-06 DIAGNOSIS — R102 Pelvic and perineal pain: Secondary | ICD-10-CM | POA: Diagnosis not present

## 2021-03-06 DIAGNOSIS — R350 Frequency of micturition: Secondary | ICD-10-CM | POA: Diagnosis present

## 2021-03-06 DIAGNOSIS — Z113 Encounter for screening for infections with a predominantly sexual mode of transmission: Secondary | ICD-10-CM | POA: Diagnosis not present

## 2021-03-06 LAB — POCT URINALYSIS DIPSTICK, ED / UC
Bilirubin Urine: NEGATIVE
Glucose, UA: NEGATIVE mg/dL
Ketones, ur: 40 mg/dL — AB
Nitrite: POSITIVE — AB
Protein, ur: 100 mg/dL — AB
Specific Gravity, Urine: 1.025 (ref 1.005–1.030)
Urobilinogen, UA: 0.2 mg/dL (ref 0.0–1.0)
pH: 5.5 (ref 5.0–8.0)

## 2021-03-06 LAB — POC URINE PREG, ED: Preg Test, Ur: NEGATIVE

## 2021-03-06 MED ORDER — ONDANSETRON HCL 4 MG/2ML IJ SOLN
4.0000 mg | Freq: Once | INTRAMUSCULAR | Status: AC
  Administered 2021-03-06: 4 mg via INTRAMUSCULAR

## 2021-03-06 MED ORDER — CEPHALEXIN 500 MG PO CAPS: 500.0000 mg | ORAL_CAPSULE | Freq: Two times a day (BID) | ORAL | 0 refills | Status: DC

## 2021-03-06 MED ORDER — ONDANSETRON 4 MG PO TBDP: 4.0000 mg | ORAL_TABLET | Freq: Three times a day (TID) | ORAL | 0 refills | Status: AC | PRN

## 2021-03-06 MED ORDER — ONDANSETRON HCL 4 MG/2ML IJ SOLN
INTRAMUSCULAR | Status: AC
  Filled 2021-03-06: qty 2

## 2021-03-06 NOTE — ED Provider Notes (Signed)
[facility]   MRN: [id number] DOB: 12-Jun-1986  Subjective:   Angela Mercado is a 35 y.o. female presenting for 1 day history of acute onset urinary frequency, urinary urgency, pelvic and vaginal pressure.  Started vomiting as well today.  Has a history of frequent UTIs.  Has not seen a urologist for this.  Would also like STI testing.  Patient is sexually active.  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  Does not want testing for HIV or syphilis today.  Does not hydrate with water very well.   Current Facility-Administered Medications:  .  ondansetron (ZOFRAN) injection 4 mg, 4 mg, Intramuscular, Once, Wallis Bamberg, PA-C  Current Outpatient Medications:  .  benzonatate (TESSALON) 100 MG capsule, Take 1 capsule (100 mg total) by mouth every 8 (eight) hours. (Patient not taking: No sig reported), Disp: 21 capsule, Rfl: 0 .  doxycycline (VIBRA-TABS) 100 MG tablet, Take 1 tablet (100 mg total) by mouth 2 (two) times daily. (Patient not taking: No sig reported), Disp: 14 tablet, Rfl: 0 .  loperamide (IMODIUM) 2 MG capsule, Take 1 capsule (2 mg total) by mouth 4 (four) times daily as needed for diarrhea or loose stools. (Patient not taking: No sig reported), Disp: 12 capsule, Rfl: 0 .  metroNIDAZOLE (FLAGYL) 500 MG tablet, Take 1 tablet (500 mg total) by mouth 2 (two) times daily., Disp: 14 tablet, Rfl: 0 .  ondansetron (ZOFRAN ODT) 4 MG disintegrating tablet, Take 1 tablet (4 mg total) by mouth every 8 (eight) hours as needed. (Patient not taking: No sig reported), Disp: 20 tablet, Rfl: 0   Allergies  Allergen Reactions  . Oxycodone-Acetaminophen Itching    Past Medical History:  Diagnosis Date  . Chronic kidney disease   . History of HPV infection   . History of syphilis   . Hx of chlamydia infection   . Hx of gonorrhea      Past Surgical History:  Procedure Laterality Date  . PLACEMENT OF BREAST IMPLANTS    . WISDOM TOOTH EXTRACTION       Family History  Problem Relation Age of Onset  . COPD Father     Social History   Tobacco Use  . Smoking status: Current Every Day Smoker    Types: Cigarettes    Last attempt to quit: 06/05/2013    Years since quitting: 7.7  . Smokeless tobacco: Never Used  Vaping Use  . Vaping Use: Never used  Substance Use Topics  . Alcohol use: Yes    Comment: daily  . Drug use: No    ROS   Objective:   Vitals: BP 119/82 (BP Location: Left Arm)   Pulse 82   Temp 97.8 F (36.6 C) (Oral)   Resp 18   LMP 02/21/2021   SpO2 98%   Physical Exam Constitutional:      General: She is not in acute distress.    Appearance: Normal appearance. She is well-developed. She is not ill-appearing, toxic-appearing or diaphoretic.  HENT:     Head: Normocephalic and atraumatic.     Nose: Nose normal.     Mouth/Throat:     Mouth: Mucous membranes are moist.     Pharynx: Oropharynx is clear.  Eyes:     General: No scleral icterus.       Right eye: No discharge.        Left eye: No discharge.     Extraocular Movements: Extraocular movements intact.  Conjunctiva/sclera: Conjunctivae normal.     Pupils: Pupils are equal, round, and reactive to light.  Cardiovascular:     Rate and Rhythm: Normal rate.  Pulmonary:     Effort: Pulmonary effort is normal.  Abdominal:     General: Bowel sounds are normal. There is no distension.     Palpations: Abdomen is soft. There is no mass.     Tenderness: There is no abdominal tenderness. There is no right CVA tenderness, left CVA tenderness, guarding or rebound.  Skin:    General: Skin is warm and dry.  Neurological:     General: No focal deficit present.     Mental Status: She is alert and oriented to person, place, and time.  Psychiatric:        Mood and Affect: Mood normal.        Behavior: Behavior normal.        Thought Content: Thought content normal.        Judgment: Judgment normal.     Results for orders placed or performed during  the hospital encounter of 03/06/21 (from the past 24 hour(s))  POC Urinalysis dipstick     Status: Abnormal   Collection Time: 03/06/21  7:17 PM  Result Value Ref Range   Glucose, UA NEGATIVE NEGATIVE mg/dL   Bilirubin Urine NEGATIVE NEGATIVE   Ketones, ur 40 (A) NEGATIVE mg/dL   Specific Gravity, Urine 1.025 1.005 - 1.030   Hgb urine dipstick LARGE (A) NEGATIVE   pH 5.5 5.0 - 8.0   Protein, ur 100 (A) NEGATIVE mg/dL   Urobilinogen, UA 0.2 0.0 - 1.0 mg/dL   Nitrite POSITIVE (A) NEGATIVE   Leukocytes,Ua SMALL (A) NEGATIVE  POC urine pregnancy     Status: None   Collection Time: 03/06/21  7:21 PM  Result Value Ref Range   Preg Test, Ur NEGATIVE NEGATIVE    Assessment and Plan :   PDMP not reviewed this encounter.  1. Acute cystitis with hematuria   2. Pelvic pressure in female   3. Frequent UTI     Start Keflex to cover for acute cystitis, urine culture pending.  Recommended aggressive hydration, limiting urinary irritants.  We will treat for STIs based off of lab results.  Counseled patient on potential for adverse effects with medications prescribed/recommended today, ER and return-to-clinic precautions discussed, patient verbalized understanding.    Wallis Bamberg, [place] 03/06/21 [phone]

## 2021-03-06 NOTE — Discharge Instructions (Signed)

## 2021-03-06 NOTE — ED Triage Notes (Signed)
Pt presents with vaginal pressure and vomiting today; pt is requesting STD testing.

## 2021-03-07 ENCOUNTER — Telehealth (HOSPITAL_COMMUNITY): Payer: Self-pay | Admitting: Emergency Medicine

## 2021-03-07 LAB — CERVICOVAGINAL ANCILLARY ONLY
Bacterial Vaginitis (gardnerella): NEGATIVE
Candida Glabrata: NEGATIVE
Candida Vaginitis: NEGATIVE
Chlamydia: POSITIVE — AB
Comment: NEGATIVE
Comment: NEGATIVE
Comment: NEGATIVE
Comment: NEGATIVE
Comment: NEGATIVE
Comment: NORMAL
Neisseria Gonorrhea: POSITIVE — AB
Trichomonas: NEGATIVE

## 2021-03-07 MED ORDER — DOXYCYCLINE HYCLATE 100 MG PO CAPS: 100.0000 mg | ORAL_CAPSULE | Freq: Two times a day (BID) | ORAL | 0 refills | Status: AC

## 2021-03-07 NOTE — Progress Notes (Signed)
Per protocol, patient will need treatment with IM Rocephin 500mg  and Doxycycline. Attempted to reach patient x 1, [facility] notified Prescription sent to pharmacy on file

## 2021-03-09 LAB — URINE CULTURE: Culture: >=100000 — AB

## 2021-03-11 ENCOUNTER — Ambulatory Visit (HOSPITAL_COMMUNITY): Payer: Medicaid Other

## 2021-03-17 ENCOUNTER — Ambulatory Visit (HOSPITAL_COMMUNITY)
Admission: EM | Admit: 2021-03-17 | Discharge: 2021-03-17 | Disposition: A | Discharge status: Home / Self Care | Payer: Medicaid Other | Attending: Emergency Medicine | Admitting: Emergency Medicine

## 2021-03-17 ENCOUNTER — Encounter (HOSPITAL_COMMUNITY): Payer: Self-pay

## 2021-03-17 ENCOUNTER — Other Ambulatory Visit: Payer: Self-pay

## 2021-03-17 DIAGNOSIS — A549 Gonococcal infection, unspecified: Secondary | ICD-10-CM

## 2021-03-17 DIAGNOSIS — R590 Localized enlarged lymph nodes: Secondary | ICD-10-CM | POA: Diagnosis not present

## 2021-03-17 MED ORDER — CEFTRIAXONE SODIUM 500 MG IJ SOLR
INTRAMUSCULAR | Status: AC
  Filled 2021-03-17: qty 500

## 2021-03-17 MED ORDER — LIDOCAINE HCL (PF) 1 % IJ SOLN
INTRAMUSCULAR | Status: AC
  Filled 2021-03-17: qty 2

## 2021-03-17 MED ORDER — CEFTRIAXONE SODIUM 500 MG IJ SOLR
500.0000 mg | Freq: Once | INTRAMUSCULAR | Status: AC
  Administered 2021-03-17: 500 mg via INTRAMUSCULAR

## 2021-03-17 NOTE — ED Provider Notes (Signed)
[name]  ____________________________________________  Time seen: Approximately 8:56 AM  I have reviewed the triage vital signs and the nursing notes.   HISTORY  Chief Complaint Abscess    HPI Angela Mercado is a 35 y.o. female who presents the emergency department complaining of "lumps" under her armpits as well as need for Rocephin shot for gonorrhea.  Patient states that she had symptoms, was tested and was positive for gonorrhea and chlamydia.  Patient has had oral antibiotics called in but she was advised to come in to the urgent care for injection.  Patient is currently denying any symptoms to include pelvic pain, vaginal bleeding or discharge.  Patient is also concerned that she has had some "lumps" under both armpits.  Both were tender and now they are improving at this time.  No fevers or chills, URI symptoms.       Past Medical History:  Diagnosis Date   Chronic kidney disease    History of HPV infection    History of syphilis    Hx of chlamydia infection    Hx of gonorrhea     Patient Active Problem List   Diagnosis Date Noted   Positive GBS test 07/05/2014   Hx of pre-eclampsia in prior pregnancy, currently pregnant 07/05/2014   Hx of breast augmentation 2010 07/05/2014   Vaginal delivery 07/05/2014   Smoker 07/04/2014   Rubella non-immune status, antepartum 07/04/2014   Frequent UTI 05/12/2013    Past Surgical History:  Procedure Laterality Date   PLACEMENT OF BREAST IMPLANTS     WISDOM TOOTH EXTRACTION      Prior to Admission medications   Medication Sig Start Date End Date Taking? Authorizing Provider  cephALEXin (KEFLEX) 500 MG capsule Take 1 capsule (500 mg total) by mouth 2 (two) times daily. 03/06/21   Wallis Bamberg, PA-C  metroNIDAZOLE (FLAGYL) 500 MG tablet Take 1 tablet (500 mg total) by mouth 2 (two) times daily. 01/30/21   Merrilee Jansky, MD  ondansetron (ZOFRAN ODT) 4 MG disintegrating tablet Take 1 tablet (4 mg total) by mouth  every 8 (eight) hours as needed. 03/06/21   Wallis Bamberg, PA-C    Allergies Oxycodone-acetaminophen  Family History  Problem Relation Age of Onset   COPD Father     Social History Social History   Tobacco Use   Smoking status: Every Day    Pack years: 0.00    Types: Cigarettes    Last attempt to quit: 06/05/2013    Years since quitting: 7.7   Smokeless tobacco: Never  Vaping Use   Vaping Use: Never used  Substance Use Topics   Alcohol use: Yes    Comment: daily   Drug use: No     Review of Systems  Constitutional: No fever/chills Eyes: No visual changes. No discharge ENT: No upper respiratory complaints. Cardiovascular: no chest pain. Respiratory: no cough. No SOB. Gastrointestinal: No abdominal pain.  No nausea, no vomiting.  No diarrhea.  No constipation. Genitourinary: Positive for known gonorrhea and chlamydia.  Negative for dysuria. No hematuria Musculoskeletal: Negative for musculoskeletal pain. Skin: Negative for rash, abrasions, lacerations, ecchymosis.  Positive for "months" and bilateral axilla. Neurological: Negative for headaches, focal weakness or numbness.  10 System ROS otherwise negative.  ____________________________________________   PHYSICAL EXAM:  VITAL SIGNS: ED Triage Vitals  Enc Vitals Group     BP 03/17/21 0833 124/68     Pulse Rate 03/17/21 0833 (!) 53     Resp 03/17/21 0833 15  Temp 03/17/21 0833 98.2 F (36.8 C)     Temp Source 03/17/21 0833 Oral     SpO2 03/17/21 0833 100 %     Weight --      Height --      Head Circumference --      Peak Flow --      Pain Score 03/17/21 0832 0     Pain Loc --      Pain Edu? --      Excl. in GC? --      Constitutional: Alert and oriented. Well appearing and in no acute distress. Eyes: Conjunctivae are normal. PERRL. EOMI. Head: Atraumatic. ENT:      Ears:       Nose: No congestion/rhinnorhea.      Mouth/Throat: Mucous membranes are moist.  Neck: No stridor.   Hematological/Lymphatic/Immunilogical: No cervical lymphadenopathy.  Patient has a palpable solitary lymph node in the right axilla.  No palpable enlarged lymph nodes in the left axilla.  No overlying skin changes. Cardiovascular: Normal rate, regular rhythm. Normal S1 and S2.  Good peripheral circulation. Respiratory: Normal respiratory effort without tachypnea or retractions. Lungs CTAB. Good air entry to the bases with no decreased or absent breath sounds. Musculoskeletal: Full range of motion to all extremities. No gross deformities appreciated. Neurologic:  Normal speech and language. No gross focal neurologic deficits are appreciated.  Skin:  Skin is warm, dry and intact. No rash noted. Psychiatric: Mood and affect are normal. Speech and behavior are normal. Patient exhibits appropriate insight and judgement.   ____________________________________________   LABS (all labs ordered are listed, but only abnormal results are displayed)  Labs Reviewed - No data to display ____________________________________________  EKG   ____________________________________________  RADIOLOGY   No results found.  ____________________________________________    PROCEDURES  Procedure(s) performed:    Procedures    Medications  cefTRIAXone (ROCEPHIN) injection 500 mg (has no administration in time range)     ____________________________________________   INITIAL IMPRESSION / ASSESSMENT AND PLAN / ED COURSE  Pertinent labs & imaging results that were available during my care of the patient were reviewed by me and considered in my medical decision making (see chart for details).  Review of the [place] CSRS was performed in accordance of the [facility] prior to dispensing any controlled drugs.           Patient's diagnosis is consistent with gonorrhea, and axillary lymphadenopathy.  Patient presented to the urgent care for Rocephin shot.  Patient tested positive for gonorrhea chlamydia.   She has been started on oral antibiotics for chlamydia but needs Rocephin shot.  This will be provided for the patient at this time.  Patient was also concerned she had some "lumps" in her bilateral axilla.  Palpation reveals a solitary enlarged lymph node to the right axilla with no overlying skin changes.  There is no other concerning signs or symptoms.  No evidence of cellulitis or abscess.  At this time no treatment, labs, or imaging is deemed necessary for this finding.  Concerning signs and symptoms are discussed with the patient.  Follow-up primary care as needed.  Return to the urgent care if needed as well.. Patient is given ED precautions to return to the ED for any worsening or new symptoms.     ____________________________________________  FINAL CLINICAL IMPRESSION(S) / DIAGNOSES  Final diagnoses:  Gonorrhea  Lymphadenopathy, axillary      NEW MEDICATIONS STARTED DURING THIS VISIT:  ED Discharge Orders  None           This chart was dictated using voice recognition software/Dragon. Despite best efforts to proofread, errors can occur which can change the meaning. Any change was purely unintentional.    Racheal Patches, PA-C 03/17/21 [id number]

## 2021-03-17 NOTE — ED Triage Notes (Signed)
Pt reports having a bump in the right axilla x 4 days. Denies fever, chills, drainage.   Pt reports she is being treat it for Chlamydia and Gonorrhea and needs a shot.

## 2021-06-13 ENCOUNTER — Ambulatory Visit (HOSPITAL_COMMUNITY)
Admission: EM | Admit: 2021-06-13 | Discharge: 2021-06-13 | Disposition: A | Discharge status: Home / Self Care | Payer: Medicaid Other | Attending: Family Medicine | Admitting: Family Medicine

## 2021-06-13 ENCOUNTER — Other Ambulatory Visit: Payer: Self-pay

## 2021-06-13 ENCOUNTER — Encounter (HOSPITAL_COMMUNITY): Payer: Self-pay

## 2021-06-13 DIAGNOSIS — R102 Pelvic and perineal pain: Secondary | ICD-10-CM | POA: Diagnosis present

## 2021-06-13 DIAGNOSIS — Z202 Contact with and (suspected) exposure to infections with a predominantly sexual mode of transmission: Secondary | ICD-10-CM | POA: Insufficient documentation

## 2021-06-13 DIAGNOSIS — N898 Other specified noninflammatory disorders of vagina: Secondary | ICD-10-CM | POA: Diagnosis not present

## 2021-06-13 LAB — POCT URINALYSIS DIPSTICK, ED / UC
Bilirubin Urine: NEGATIVE
Glucose, UA: NEGATIVE mg/dL
Ketones, ur: NEGATIVE mg/dL
Nitrite: POSITIVE — AB
Protein, ur: NEGATIVE mg/dL
Specific Gravity, Urine: 1.02 (ref 1.005–1.030)
Urobilinogen, UA: 0.2 mg/dL (ref 0.0–1.0)
pH: 7 (ref 5.0–8.0)

## 2021-06-13 LAB — HIV ANTIBODY (ROUTINE TESTING W REFLEX): HIV Screen 4th Generation wRfx: NONREACTIVE

## 2021-06-13 LAB — POC URINE PREG, ED: Preg Test, Ur: NEGATIVE

## 2021-06-13 MED ORDER — LIDOCAINE HCL (PF) 1 % IJ SOLN
INTRAMUSCULAR | Status: AC
  Filled 2021-06-13: qty 2

## 2021-06-13 MED ORDER — NITROFURANTOIN MONOHYD MACRO 100 MG PO CAPS: 100.0000 mg | ORAL_CAPSULE | Freq: Two times a day (BID) | ORAL | 0 refills | Status: DC

## 2021-06-13 MED ORDER — CEFTRIAXONE SODIUM 500 MG IJ SOLR
INTRAMUSCULAR | Status: AC
  Filled 2021-06-13: qty 500

## 2021-06-13 MED ORDER — CEFTRIAXONE SODIUM 500 MG IJ SOLR
500.0000 mg | Freq: Once | INTRAMUSCULAR | Status: AC
  Administered 2021-06-13: 500 mg via INTRAMUSCULAR

## 2021-06-13 MED ORDER — DOXYCYCLINE HYCLATE 100 MG PO CAPS: 100.0000 mg | ORAL_CAPSULE | Freq: Two times a day (BID) | ORAL | 0 refills | Status: DC

## 2021-06-13 NOTE — ED Triage Notes (Signed)
Pt reports being exposed to a partner that has HPV, Chlamydia and Gonorrhea. Pt states she has cloudy urine and a strong smell in her urine. States she has light vaginal discharge.

## 2021-06-13 NOTE — ED Provider Notes (Signed)
[facility]    CSN: [id number] Arrival date & time: 06/13/21  2751      History   Chief Complaint Chief Complaint  Patient presents with   SEXUALLY TRANSMITTED DISEASE    HPI Angela Mercado is a 35 y.o. female.   Patient presenting today with suprapubic abdominal pain, cloudy urine, malodorous urine, vaginal discharge.  She states she was recently alerted that her sexual partner tested positive for HPV, gonorrhea, chlamydia.  Denies fever, chills, nausea, vomiting, bowel changes, vaginal bleeding.  LMP was 06/05/2021.  Does have a history of gonorrhea, chlamydia, syphilis per chart review.  Past Medical History:  Diagnosis Date   Chronic kidney disease    History of HPV infection    History of syphilis    Hx of chlamydia infection    Hx of gonorrhea     Patient Active Problem List   Diagnosis Date Noted   Positive GBS test 07/05/2014   Hx of pre-eclampsia in prior pregnancy, currently pregnant 07/05/2014   Hx of breast augmentation 2010 07/05/2014   Vaginal delivery 07/05/2014   Smoker 07/04/2014   Rubella non-immune status, antepartum 07/04/2014   Frequent UTI 05/12/2013    Past Surgical History:  Procedure Laterality Date   PLACEMENT OF BREAST IMPLANTS     WISDOM TOOTH EXTRACTION      OB History     Gravida  4   Para  3   Term  3   Preterm      AB  1   Living  3      SAB  1   IAB      Ectopic      Multiple      Live Births  3            Home Medications    Prior to Admission medications   Medication Sig Start Date End Date Taking? Authorizing Provider  doxycycline (VIBRAMYCIN) 100 MG capsule Take 1 capsule (100 mg total) by mouth 2 (two) times daily. 06/13/21  Yes Particia Nearing, PA-C  nitrofurantoin, macrocrystal-monohydrate, (MACROBID) 100 MG capsule Take 1 capsule (100 mg total) by mouth 2 (two) times daily. 06/13/21  Yes Particia Nearing, PA-C  cephALEXin (KEFLEX) 500 MG capsule Take 1 capsule (500 mg total) by  mouth 2 (two) times daily. 03/06/21   Wallis Bamberg, PA-C  metroNIDAZOLE (FLAGYL) 500 MG tablet Take 1 tablet (500 mg total) by mouth 2 (two) times daily. 01/30/21   Merrilee Jansky, MD  ondansetron (ZOFRAN ODT) 4 MG disintegrating tablet Take 1 tablet (4 mg total) by mouth every 8 (eight) hours as needed. 03/06/21   Wallis Bamberg, PA-C    Family History Family History  Problem Relation Age of Onset   COPD Father     Social History Social History   Tobacco Use   Smoking status: Every Day    Types: Cigarettes    Last attempt to quit: 06/05/2013    Years since quitting: 8.0   Smokeless tobacco: Never  Vaping Use   Vaping Use: Never used  Substance Use Topics   Alcohol use: Yes    Comment: daily   Drug use: No     Allergies   Oxycodone-acetaminophen   Review of Systems Review of Systems Per HPI  Physical Exam Triage Vital Signs ED Triage Vitals  Enc Vitals Group     BP 06/13/21 0936 (!) 135/91     Pulse Rate 06/13/21 0936 69     Resp 06/13/21  0936 18     Temp 06/13/21 0936 98.5 F (36.9 C)     Temp Source 06/13/21 0936 Oral     SpO2 06/13/21 0936 100 %     Weight --      Height --      Head Circumference --      Peak Flow --      Pain Score 06/13/21 0934 0     Pain Loc --      Pain Edu? --      Excl. in GC? --    No data found.  Updated Vital Signs BP (!) 135/91 (BP Location: Right Arm)   Pulse 69   Temp 98.5 F (36.9 C) (Oral)   Resp 18   LMP 06/05/2021 (Exact Date)   SpO2 100%   Visual Acuity Right Eye Distance:   Left Eye Distance:   Bilateral Distance:    Right Eye Near:   Left Eye Near:    Bilateral Near:     Physical Exam Vitals and nursing note reviewed.  Constitutional:      Appearance: Normal appearance. She is not ill-appearing.  HENT:     Head: Atraumatic.     Mouth/Throat:     Mouth: Mucous membranes are moist.  Eyes:     Extraocular Movements: Extraocular movements intact.     Conjunctiva/sclera: Conjunctivae normal.   Cardiovascular:     Rate and Rhythm: Normal rate and regular rhythm.     Heart sounds: Normal heart sounds.  Pulmonary:     Effort: Pulmonary effort is normal.     Breath sounds: Normal breath sounds.  Abdominal:     General: Bowel sounds are normal. There is no distension.     Palpations: Abdomen is soft. There is no mass.     Tenderness: There is abdominal tenderness. There is no right CVA tenderness, left CVA tenderness or guarding.     Comments: Mild suprapubic tenderness palpation  Genitourinary:    Comments: GU exam deferred, self swab performed Musculoskeletal:        General: Normal range of motion.     Cervical back: Normal range of motion and neck supple.  Skin:    General: Skin is warm and dry.  Neurological:     Mental Status: She is alert and oriented to person, place, and time.  Psychiatric:        Mood and Affect: Mood normal.        Thought Content: Thought content normal.        Judgment: Judgment normal.   UC Treatments / Results  Labs (all labs ordered are listed, but only abnormal results are displayed) Labs Reviewed  POCT URINALYSIS DIPSTICK, ED / UC - Abnormal; Notable for the following components:      Result Value   Hgb urine dipstick TRACE (*)    Nitrite POSITIVE (*)    Leukocytes,Ua MODERATE (*)    All other components within normal limits  URINE CULTURE  RPR  HIV ANTIBODY (ROUTINE TESTING W REFLEX)  POC URINE PREG, ED  CERVICOVAGINAL ANCILLARY ONLY    EKG   Radiology No results found.  Procedures Procedures (including critical care time)  Medications Ordered in UC Medications  cefTRIAXone (ROCEPHIN) injection 500 mg (500 mg Intramuscular Given 06/13/21 1116)    Initial Impression / Assessment and Plan / UC Course  I have reviewed the triage vital signs and the nursing notes.  Pertinent labs & imaging results that were available during my care of  the patient were reviewed by me and considered in my medical decision making (see chart  for details).     Overall vital signs reassuring, exam without significant abnormal findings.  Does appear to have a urinary tract infection based on the urinalysis today, will send this out for a culture additionally and start Macrobid.  Given her known exposure to gonorrhea and chlamydia, will treat for both as well today while awaiting the vaginal swab results.  Discussed safe sexual practices and abstinence until results return.  Return for worsening symptoms at any time.  Final Clinical Impressions(s) / UC Diagnoses   Final diagnoses:  Vaginal discharge  Suprapubic pain  Exposure to gonorrhea  Exposure to chlamydia   Discharge Instructions   None    ED Prescriptions     Medication Sig Dispense Auth. Provider   nitrofurantoin, macrocrystal-monohydrate, (MACROBID) 100 MG capsule Take 1 capsule (100 mg total) by mouth 2 (two) times daily. 10 capsule Particia Nearing, [place]   doxycycline (VIBRAMYCIN) 100 MG capsule Take 1 capsule (100 mg total) by mouth 2 (two) times daily. 14 capsule Particia Nearing, [place]      PDMP not reviewed this encounter.   Particia Nearing, [place] 06/13/21 1251

## 2021-06-14 LAB — RPR: RPR Ser Ql: NONREACTIVE

## 2021-06-15 LAB — URINE CULTURE: Culture: >=100000 — AB

## 2021-06-16 LAB — CERVICOVAGINAL ANCILLARY ONLY
Bacterial Vaginitis (gardnerella): POSITIVE — AB
Candida Glabrata: POSITIVE — AB
Candida Vaginitis: NEGATIVE
Chlamydia: NEGATIVE
Comment: NEGATIVE
Comment: NEGATIVE
Comment: NEGATIVE
Comment: NEGATIVE
Comment: NEGATIVE
Comment: NORMAL
Neisseria Gonorrhea: NEGATIVE
Trichomonas: NEGATIVE

## 2021-06-18 ENCOUNTER — Telehealth (HOSPITAL_COMMUNITY): Payer: Self-pay | Admitting: Emergency Medicine

## 2021-06-18 MED ORDER — METRONIDAZOLE 500 MG PO TABS: 500.0000 mg | ORAL_TABLET | Freq: Two times a day (BID) | ORAL | 0 refills | Status: DC

## 2021-06-18 MED ORDER — FLUCONAZOLE 150 MG PO TABS: 150.0000 mg | ORAL_TABLET | Freq: Once | ORAL | 0 refills | Status: AC

## 2021-08-18 ENCOUNTER — Other Ambulatory Visit: Payer: Self-pay

## 2022-06-25 IMAGING — US US OB < 14 WEEKS - US OB TV
1 series · 13 of 28 positions shown · non-contrast
Comparison: None for this gestation

CLINICAL DATA: First trimester pregnancy, abdominal pain, history
of RIGHT-sided ectopic pregnancy 5 years ago with subsequent
salpingectomy

EXAM:
OBSTETRIC <14 WK US AND TRANSVAGINAL OB US
TECHNIQUE: Both transabdominal and transvaginal ultrasound examinations were
performed for complete evaluation of the gestation as well as the
maternal uterus, adnexal regions, and pelvic cul-de-sac.
Transvaginal technique was performed to assess early pregnancy.

[Series 1: us ob < 14 weeks - us ob tv · 13 of 47 slices shown]
[im 2/47]
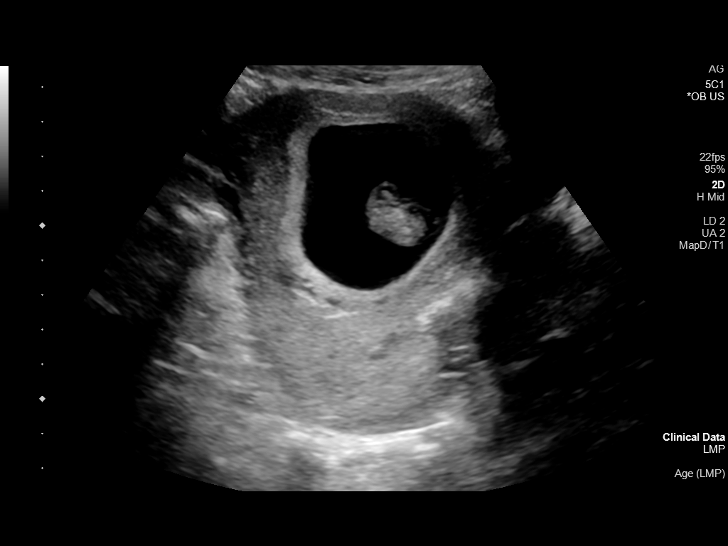
[im 6/47]
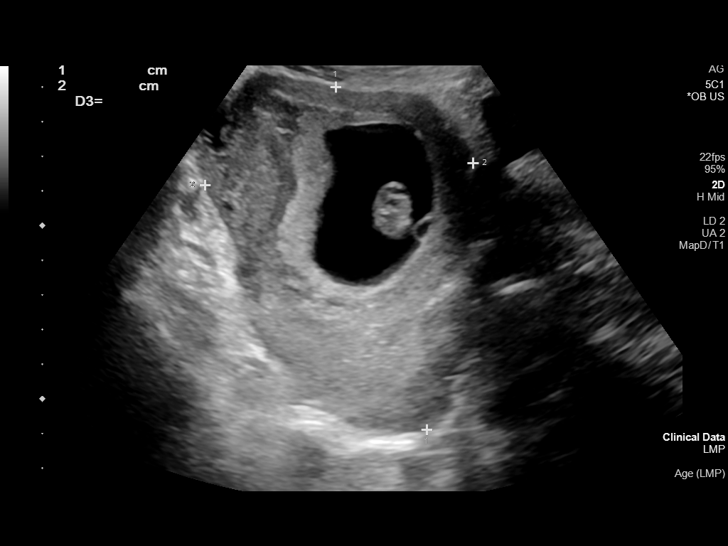
[im 9/47]
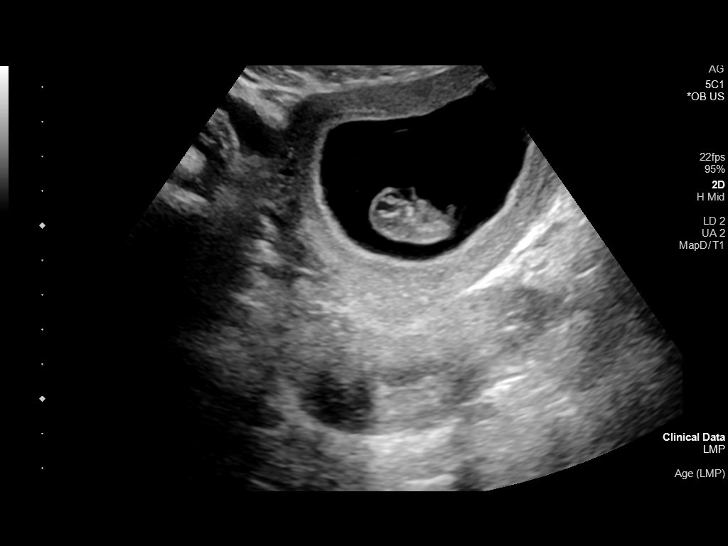
[im 12/47]
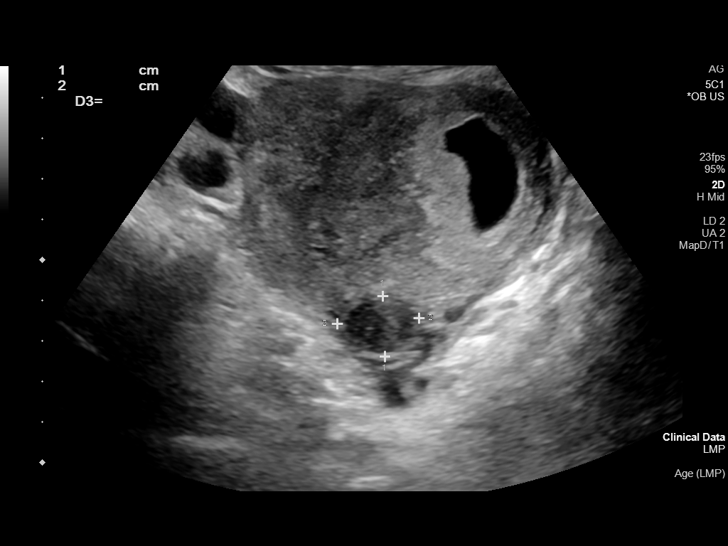
[im 16/47]
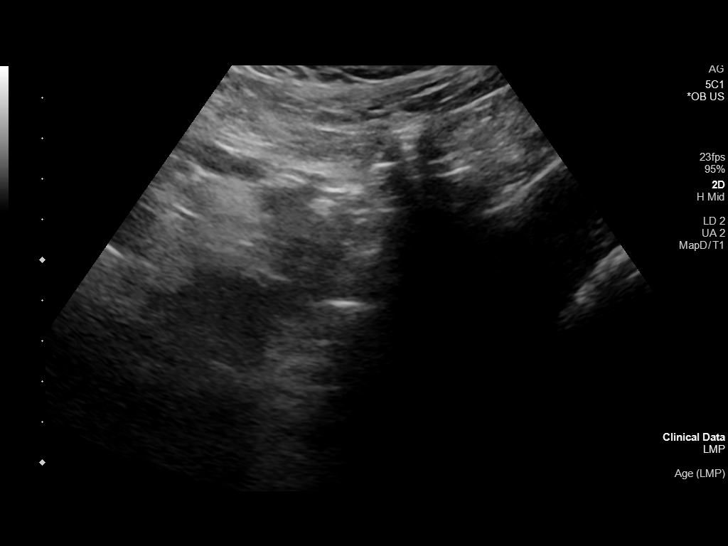
[im 19/47]
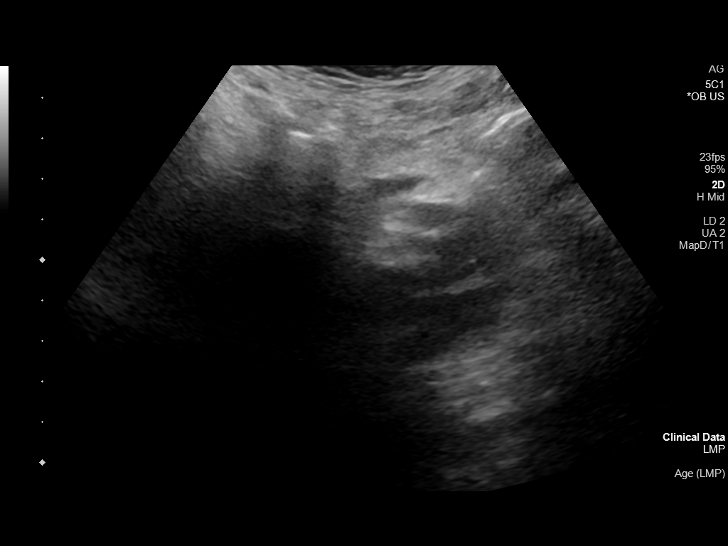
[im 24/47]
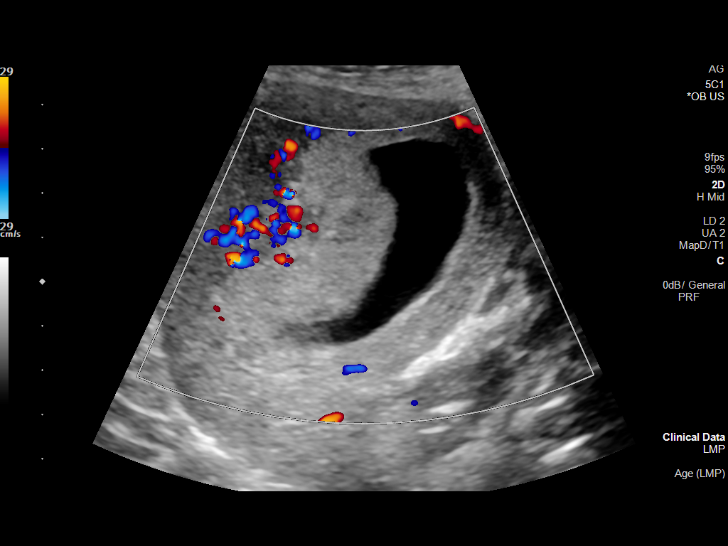
[im 28/47]
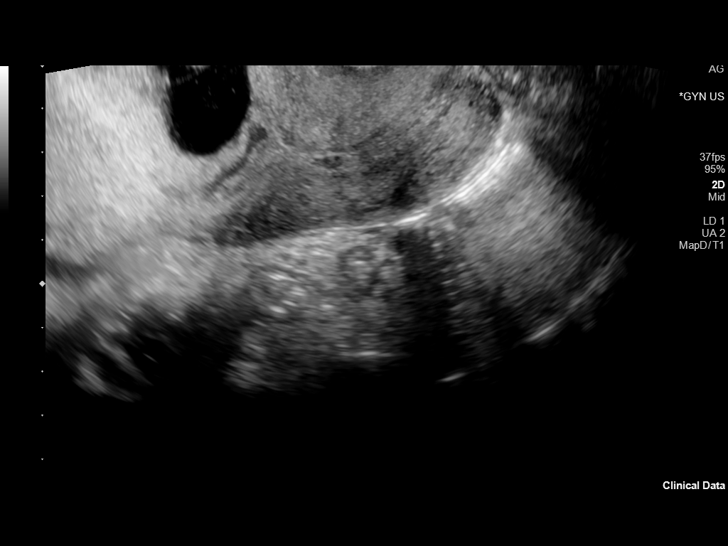
[im 31/47]
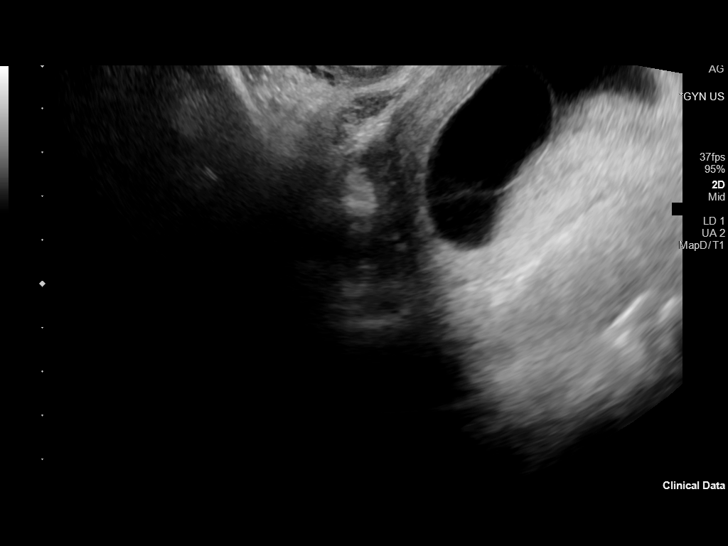
[im 35/47]
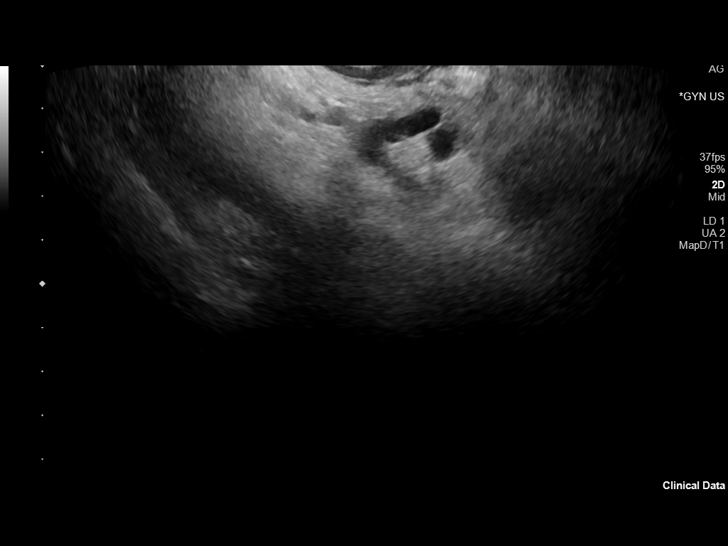
[im 38/47]
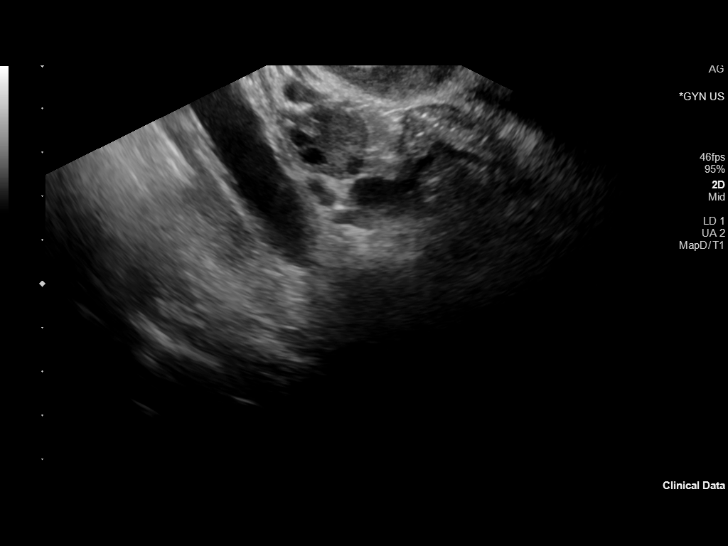
[im 41/47]
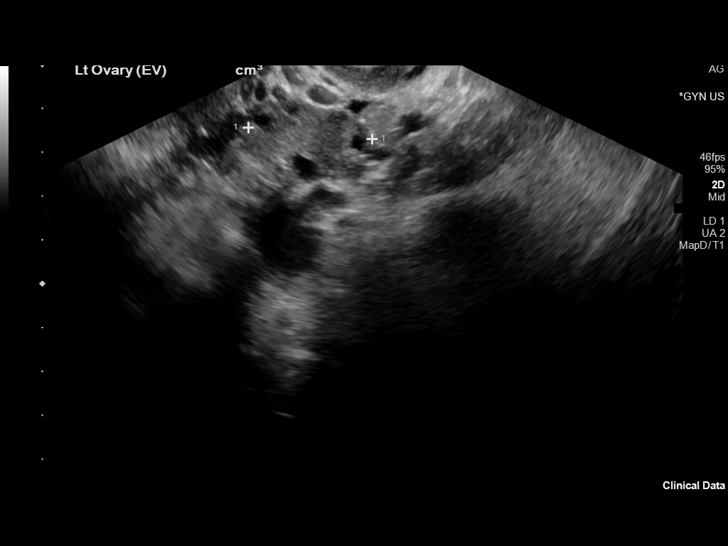
[im 45/47]
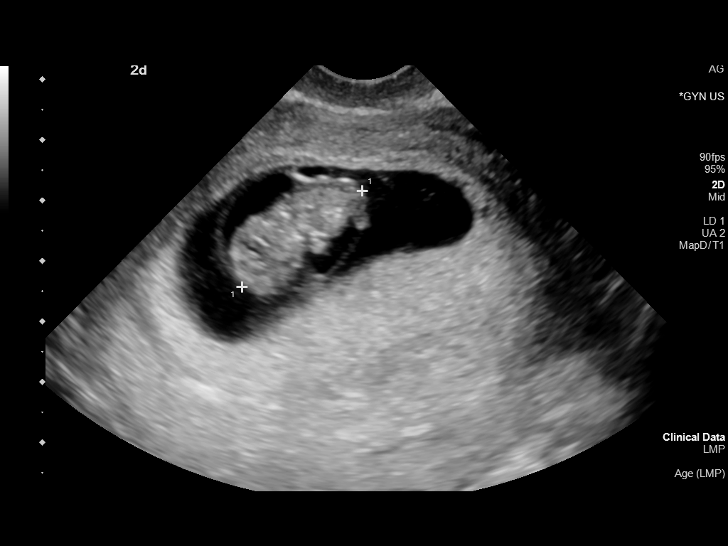

[13 of 28 positions shown; findings below may reference images not displayed]

FINDINGS: Intrauterine gestational sac: Present, single

Yolk sac:  Present

Embryo:  Present

Cardiac Activity: Present

Heart Rate: 164 bpm

CRL:  25 mm   9 w   2 d                  US EDC: 06/26/2021

Subchorionic hemorrhage:  None visualized.

Maternal uterus/adnexae:

Uterus anteverted with a small posterior upper uterine segment
leiomyoma subserosal 2.0 x 1.5 x 2.0 cm.

LEFT ovary normal size and morphology, 2.1 x 1.9 x 2.8 cm.

RIGHT ovary not visualized.

No free pelvic fluid or adnexal masses.
IMPRESSION: Single live intrauterine gestation at 9 weeks 2 days EGA by
crown-rump length.

Small subserosal leiomyoma at posterior upper uterus 2.0 cm
diameter.

Nonvisualization of RIGHT ovary.

## 2022-06-26 ENCOUNTER — Ambulatory Visit
Admission: EM | Admit: 2022-06-26 | Discharge: 2022-06-26 | Disposition: A | Discharge status: Home / Self Care | Payer: Medicaid Other | Attending: Internal Medicine | Admitting: Internal Medicine

## 2022-06-26 ENCOUNTER — Encounter: Payer: Self-pay | Admitting: Emergency Medicine

## 2022-06-26 DIAGNOSIS — Z113 Encounter for screening for infections with a predominantly sexual mode of transmission: Secondary | ICD-10-CM

## 2022-06-26 DIAGNOSIS — R8290 Unspecified abnormal findings in urine: Secondary | ICD-10-CM | POA: Diagnosis present

## 2022-06-26 DIAGNOSIS — N3001 Acute cystitis with hematuria: Secondary | ICD-10-CM

## 2022-06-26 LAB — POCT URINALYSIS DIP (MANUAL ENTRY)
Bilirubin, UA: NEGATIVE
Glucose, UA: NEGATIVE mg/dL
Ketones, POC UA: NEGATIVE mg/dL
Nitrite, UA: POSITIVE — AB
Spec Grav, UA: 1.025 (ref 1.010–1.025)
Urobilinogen, UA: 0.2 E.U./dL
pH, UA: 6 (ref 5.0–8.0)

## 2022-06-26 LAB — POCT URINE PREGNANCY: Preg Test, Ur: NEGATIVE

## 2022-06-26 MED ORDER — CIPROFLOXACIN HCL 500 MG PO TABS: 500.0000 mg | ORAL_TABLET | Freq: Two times a day (BID) | ORAL | 0 refills | Status: AC

## 2022-06-26 NOTE — ED Provider Notes (Signed)
[facility]    CSN: [id number] Arrival date & time: 06/26/22  1113      History   Chief Complaint Chief Complaint  Patient presents with   urinary odor     HPI Angela Mercado is a 36 y.o. female.   Patient presents for malodorous urine that has been present for several months.  Patient reports that she was seen on 02/25/2022 and treated with Bactrim antibiotic.  Then, symptoms never resolved so she was seen on 03/30/2022 and treated with Macrobid with no improvement.  Patient denies that she has ever had urinary burning, urinary frequency, abdominal pain, back pain, fever.  She does endorse that she has some white vaginal discharge that started about a week ago as well.  Denies any confirmed exposure to STD but would like STD testing.  Denies that she wants testing for HIV or syphilis.  Last menstrual cycle was 06/12/2022.     Past Medical History:  Diagnosis Date   Chronic kidney disease    History of HPV infection    History of syphilis    Hx of chlamydia infection    Hx of gonorrhea     Patient Active Problem List   Diagnosis Date Noted   Positive GBS test 07/05/2014   Hx of pre-eclampsia in prior pregnancy, currently pregnant 07/05/2014   Hx of breast augmentation 2010 07/05/2014   Vaginal delivery 07/05/2014   Smoker 07/04/2014   Rubella non-immune status, antepartum 07/04/2014   Frequent UTI 05/12/2013    Past Surgical History:  Procedure Laterality Date   PLACEMENT OF BREAST IMPLANTS     WISDOM TOOTH EXTRACTION      OB History     Gravida  4   Para  3   Term  3   Preterm      AB  1   Living  3      SAB  1   IAB      Ectopic      Multiple      Live Births  3            Home Medications    Prior to Admission medications   Medication Sig Start Date End Date Taking? Authorizing Provider  ciprofloxacin (CIPRO) 500 MG tablet Take 1 tablet (500 mg total) by mouth every 12 (twelve) hours. 06/26/22  Yes Jericho Cieslik, Rolly Salter E, FNP   ondansetron (ZOFRAN ODT) 4 MG disintegrating tablet Take 1 tablet (4 mg total) by mouth every 8 (eight) hours as needed. 03/06/21   Wallis Bamberg, PA-C    Family History Family History  Problem Relation Age of Onset   COPD Father     Social History Social History   Tobacco Use   Smoking status: Every Day    Types: Cigarettes    Last attempt to quit: 06/05/2013    Years since quitting: 9.0   Smokeless tobacco: Never  Vaping Use   Vaping Use: Never used  Substance Use Topics   Alcohol use: Yes    Comment: daily   Drug use: No     Allergies   Oxycodone-acetaminophen   Review of Systems Review of Systems Per HPI  Physical Exam Triage Vital Signs ED Triage Vitals [06/26/22 1200]  Enc Vitals Group     BP 122/84     Pulse Rate 84     Resp 17     Temp 98.1 F (36.7 C)     Temp src      SpO2 98 %  Weight      Height      Head Circumference      Peak Flow      Pain Score 0     Pain Loc      Pain Edu?      Excl. in GC?    No data found.  Updated Vital Signs BP 122/84   Pulse 84   Temp 98.1 F (36.7 C)   Resp 17   SpO2 98%   Visual Acuity Right Eye Distance:   Left Eye Distance:   Bilateral Distance:    Right Eye Near:   Left Eye Near:    Bilateral Near:     Physical Exam Constitutional:      General: She is not in acute distress.    Appearance: Normal appearance. She is not toxic-appearing or diaphoretic.  HENT:     Head: Normocephalic and atraumatic.  Eyes:     Extraocular Movements: Extraocular movements intact.     Conjunctiva/sclera: Conjunctivae normal.  Cardiovascular:     Rate and Rhythm: Normal rate and regular rhythm.     Pulses: Normal pulses.     Heart sounds: Normal heart sounds.  Pulmonary:     Effort: Pulmonary effort is normal. No respiratory distress.     Breath sounds: Normal breath sounds.  Abdominal:     General: Bowel sounds are normal. There is no distension.     Palpations: Abdomen is soft.     Tenderness: There is  no abdominal tenderness.  Genitourinary:    Comments: Deferred with shared decision-making.  Self swab performed. Neurological:     General: No focal deficit present.     Mental Status: She is alert and oriented to person, place, and time. Mental status is at baseline.  Psychiatric:        Mood and Affect: Mood normal.        Behavior: Behavior normal.        Thought Content: Thought content normal.        Judgment: Judgment normal.      UC Treatments / Results  Labs (all labs ordered are listed, but only abnormal results are displayed) Labs Reviewed  POCT URINALYSIS DIP (MANUAL ENTRY) - Abnormal; Notable for the following components:      Result Value   Color, UA other (*)    Clarity, UA cloudy (*)    Blood, UA trace-intact (*)    Protein Ur, POC trace (*)    Nitrite, UA Positive (*)    Leukocytes, UA Small (1+) (*)    All other components within normal limits  URINE CULTURE  POCT URINE PREGNANCY  CERVICOVAGINAL ANCILLARY ONLY    EKG   Radiology No results found.  Procedures Procedures (including critical care time)  Medications Ordered in UC Medications - No data to display  Initial Impression / Assessment and Plan / UC Course  I have reviewed the triage vital signs and the nursing notes.  Pertinent labs & imaging results that were available during my care of the patient were reviewed by me and considered in my medical decision making (see chart for details).     Urinalysis still indicating urinary tract infection.  Given persistent symptoms, will opt to treat with Cipro antibiotic.  Advised patient to avoid any strenuous physical activity or exercise while taking Cipro.  Latest creatinine clearance appears normal and no obvious contraindication to Cipro noted in patient's history.  Urine culture is pending.  Cervicovaginal swab pending per patient request  as well.  Given exposure to STD, will await results for further treatment for vaginal discharge.  Patient to  refrain from sexual activity until test results and treatment are complete.  Advised patient that it will be beneficial to follow-up with urology and PCP given persistent symptoms.  Advised strict follow-up if symptoms persist.  Patient verbalized understanding and was agreeable with plan. Final Clinical Impressions(s) / UC Diagnoses   Final diagnoses:  Acute cystitis with hematuria  Malodorous urine  Screening examination for venereal disease     Discharge Instructions      You have a urinary tract infection which is being treated with an antibiotic.  Please avoid strenuous physical activity or exercise while taking this antibiotic.  Your urine culture and vaginal swab is pending.  We will call if it is positive.  Urine pregnancy test was negative.    ED Prescriptions     Medication Sig Dispense Auth. Provider   ciprofloxacin (CIPRO) 500 MG tablet Take 1 tablet (500 mg total) by mouth every 12 (twelve) hours. 10 tablet Teodora Medici, [place]      PDMP not reviewed this encounter.   Teodora Medici, [place] 06/26/22 1350

## 2022-06-26 NOTE — Discharge Instructions (Addendum)
You have a urinary tract infection which is being treated with an antibiotic.  Please avoid strenuous physical activity or exercise while taking this antibiotic.  Your urine culture and vaginal swab is pending.  We will call if it is positive.  Urine pregnancy test was negative.

## 2022-06-26 NOTE — ED Triage Notes (Addendum)
Pt is present today with with c/o odor with urine., Pt sx started x6 months ago  Pt states that she would also like to be tested for STD

## 2022-06-28 LAB — URINE CULTURE: Culture: >=100000 — AB

## 2022-06-29 ENCOUNTER — Telehealth (HOSPITAL_COMMUNITY): Payer: Self-pay

## 2022-06-29 LAB — CERVICOVAGINAL ANCILLARY ONLY
Bacterial Vaginitis (gardnerella): POSITIVE — AB
Candida Glabrata: NEGATIVE
Candida Vaginitis: NEGATIVE
Chlamydia: NEGATIVE
Comment: NEGATIVE
Comment: NEGATIVE
Comment: NEGATIVE
Comment: NEGATIVE
Comment: NEGATIVE
Comment: NORMAL
Neisseria Gonorrhea: NEGATIVE
Trichomonas: NEGATIVE

## 2022-06-29 MED ORDER — METRONIDAZOLE 500 MG PO TABS: 500.0000 mg | ORAL_TABLET | Freq: Two times a day (BID) | ORAL | 0 refills | Status: AC

## 2022-07-27 ENCOUNTER — Ambulatory Visit (HOSPITAL_COMMUNITY)
Admission: EM | Admit: 2022-07-27 | Discharge: 2022-07-27 | Discharge status: Against Medical Advice | Payer: Medicaid Other | Attending: Family Medicine | Admitting: Family Medicine

## 2022-07-27 NOTE — ED Triage Notes (Signed)
Called from lobby twice with no response.

## 2022-07-27 NOTE — ED Notes (Signed)
Patient called from the lobby by the nurse, no response.

## 2023-09-14 DIAGNOSIS — Z87448 Personal history of other diseases of urinary system: Secondary | ICD-10-CM | POA: Diagnosis not present

## 2023-09-14 DIAGNOSIS — N939 Abnormal uterine and vaginal bleeding, unspecified: Secondary | ICD-10-CM | POA: Diagnosis not present

## 2023-09-14 DIAGNOSIS — N946 Dysmenorrhea, unspecified: Secondary | ICD-10-CM | POA: Diagnosis not present

## 2023-09-14 DIAGNOSIS — N3001 Acute cystitis with hematuria: Secondary | ICD-10-CM | POA: Diagnosis not present

## 2023-09-14 DIAGNOSIS — R3915 Urgency of urination: Secondary | ICD-10-CM | POA: Diagnosis not present

## 2023-09-14 DIAGNOSIS — R109 Unspecified abdominal pain: Secondary | ICD-10-CM | POA: Diagnosis not present

## 2023-09-14 DIAGNOSIS — R1030 Lower abdominal pain, unspecified: Secondary | ICD-10-CM | POA: Diagnosis not present

## 2023-09-16 ENCOUNTER — Encounter (HOSPITAL_COMMUNITY): Payer: Self-pay

## 2023-09-16 ENCOUNTER — Emergency Department (HOSPITAL_COMMUNITY)
Admission: EM | Admit: 2023-09-16 | Discharge: 2023-09-16 | Disposition: A | Discharge status: Home / Self Care | Payer: 59 | Attending: Emergency Medicine | Admitting: Emergency Medicine

## 2023-09-16 DIAGNOSIS — R799 Abnormal finding of blood chemistry, unspecified: Secondary | ICD-10-CM | POA: Diagnosis not present

## 2023-09-16 DIAGNOSIS — R9431 Abnormal electrocardiogram [ECG] [EKG]: Secondary | ICD-10-CM | POA: Diagnosis not present

## 2023-09-16 DIAGNOSIS — E875 Hyperkalemia: Secondary | ICD-10-CM | POA: Insufficient documentation

## 2023-09-16 DIAGNOSIS — F419 Anxiety disorder, unspecified: Secondary | ICD-10-CM | POA: Insufficient documentation

## 2023-09-16 DIAGNOSIS — R899 Unspecified abnormal finding in specimens from other organs, systems and tissues: Secondary | ICD-10-CM

## 2023-09-16 LAB — CBC WITH DIFFERENTIAL/PLATELET
Abs Immature Granulocytes: 0.02 10*3/uL (ref 0.00–0.07)
Basophils Absolute: 0.1 10*3/uL (ref 0.0–0.1)
Basophils Relative: 1 %
Eosinophils Absolute: 0.1 10*3/uL (ref 0.0–0.5)
Eosinophils Relative: 1 %
HCT: 44.7 % (ref 36.0–46.0)
Hemoglobin: 14.5 g/dL (ref 12.0–15.0)
Immature Granulocytes: 0 %
Lymphocytes Relative: 34 %
Lymphs Abs: 1.8 10*3/uL (ref 0.7–4.0)
MCH: 32.7 pg (ref 26.0–34.0)
MCHC: 32.4 g/dL (ref 30.0–36.0)
MCV: 100.7 fL — ABNORMAL HIGH (ref 80.0–100.0)
Monocytes Absolute: 0.4 10*3/uL (ref 0.1–1.0)
Monocytes Relative: 7 %
Neutro Abs: 3.1 10*3/uL (ref 1.7–7.7)
Neutrophils Relative %: 57 %
Platelets: 321 10*3/uL (ref 150–400)
RBC: 4.44 MIL/uL (ref 3.87–5.11)
RDW: 11.9 % (ref 11.5–15.5)
WBC: 5.4 10*3/uL (ref 4.0–10.5)
nRBC: 0 % (ref 0.0–0.2)

## 2023-09-16 LAB — BASIC METABOLIC PANEL
Anion gap: 11 (ref 5–15)
BUN: 8 mg/dL (ref 6–20)
CO2: 23 mmol/L (ref 22–32)
Calcium: 9.2 mg/dL (ref 8.9–10.3)
Chloride: 104 mmol/L (ref 98–111)
Creatinine, Ser: 0.89 mg/dL (ref 0.44–1.00)
GFR, Estimated: >60 mL/min (ref >60)
Glucose, Bld: 111 mg/dL — ABNORMAL HIGH (ref 70–99)
Potassium: 3.5 mmol/L (ref 3.5–5.1)
Sodium: 138 mmol/L (ref 135–145)

## 2023-09-16 LAB — HCG, QUANTITATIVE, PREGNANCY: hCG, Beta Chain, Quant, S: <1 m[IU]/mL (ref <5)

## 2023-09-16 MED ORDER — LORAZEPAM 1 MG PO TABS
1.0000 mg | ORAL_TABLET | Freq: Once | ORAL | Status: AC
Start: 2023-09-16 — End: 2023-09-16
  Administered 2023-09-16: 1 mg via ORAL
  Filled 2023-09-16: qty 1

## 2023-09-16 NOTE — ED Provider Notes (Signed)
[place] [facility] AT [facility] Provider Note   CSN: [id number] Arrival date & time: 09/16/23  1036     History  Chief Complaint  Patient presents with   Abnormal Lab    Angela Mercado is a 37 y.o. female.  With a history of kidney disease who presents to the ED for hyperkalemia.  Patient was evaluated yesterday at urgent care where labs were drawn and was noted to be hyperkalemic 6.1.  She was directed here for further evaluation.  The reason she was evaluated at urgent care was for dysfunctional uterine bleeding that has been going on for 9 months.  She has a scheduled follow-up appointment with OB/GYN for this reason.  No pelvic or abdominal pain.  Pelvic exam and testing performed yesterday's urgent care   Abnormal Lab      Home Medications Prior to Admission medications   Medication Sig Start Date End Date Taking? Authorizing Provider  ciprofloxacin (CIPRO) 500 MG tablet Take 1 tablet (500 mg total) by mouth every 12 (twelve) hours. 06/26/22   Gustavus Bryant, FNP  metroNIDAZOLE (FLAGYL) 500 MG tablet Take 1 tablet (500 mg total) by mouth 2 (two) times daily. 06/29/22   Merrilee Jansky, MD  ondansetron (ZOFRAN ODT) 4 MG disintegrating tablet Take 1 tablet (4 mg total) by mouth every 8 (eight) hours as needed. 03/06/21   Wallis Bamberg, PA-C      Allergies    Oxycodone-acetaminophen    Review of Systems   Review of Systems  Physical Exam Updated Vital Signs BP (!) 131/99 (BP Location: Left Arm)   Pulse (!) 114   Temp 97.8 F (36.6 C) (Oral)   Resp 18   LMP 09/16/2023 (Approximate)   SpO2 97%  Physical Exam Vitals and nursing note reviewed.  HENT:     Head: Normocephalic and atraumatic.  Eyes:     Pupils: Pupils are equal, round, and reactive to light.  Cardiovascular:     Rate and Rhythm: Normal rate and regular rhythm.  Pulmonary:     Effort: Pulmonary effort is normal.     Breath sounds: Normal breath sounds.  Abdominal:      Palpations: Abdomen is soft.     Tenderness: There is no abdominal tenderness.  Skin:    General: Skin is warm and dry.  Neurological:     Mental Status: She is alert.  Psychiatric:        Mood and Affect: Mood normal.     ED Results / Procedures / Treatments   Labs (all labs ordered are listed, but only abnormal results are displayed) Labs Reviewed  BASIC METABOLIC PANEL - Abnormal; Notable for the following components:      Result Value   Glucose, Bld 111 (*)    All other components within normal limits  CBC WITH DIFFERENTIAL/PLATELET - Abnormal; Notable for the following components:   MCV 100.7 (*)    All other components within normal limits  HCG, QUANTITATIVE, PREGNANCY    EKG None  Radiology No results found.  Procedures Procedures    Medications Ordered in ED Medications  LORazepam (ATIVAN) tablet 1 mg (1 mg Oral Given 09/16/23 1243)    ED Course/ Medical Decision Making/ A&P Clinical Course as of 09/16/23 1259  Thu Sep 16, 2023  1255 Potassium 3.5.  Hyperkalemia from yesterday likely laboratory error or hemolysis.  CBC unremarkable.  Pregnancy test again negative.  Patient reports feeling better after dose of Ativan.  We will provide  her with the number for our gynecologist to follow-up with for dysfunctional uterine bleeding and she will need to follow-up with the results from her test from urgent care yesterday.  Stable for discharge. [MP]    Clinical Course User Index [MP] Royanne Foots, DO                                 Medical Decision Making 37 year old female presenting for abnormal lab value.  Hyperkalemia of 6.1 noted on labs that were from urgent care yesterday.  Dysfunctional uterine bleeding over the last 9 months for which she has a standing OB/GYN appointment for.  Pregnancy test and remainder of laboratory workup from yesterday appears normal.  EKG shows no consistent changes with hyperkalemia.  Will repeat labs and reassess need to treat  hyperkalemia and look for underlying causes.  No chest pain palpitations or other complaints at this time  Risk Prescription drug management.           Final Clinical Impression(s) / ED Diagnoses Final diagnoses:  Anxiety  Abnormal laboratory test    Rx / DC Orders ED Discharge Orders     None         Royanne Foots, DO 09/16/23 1259

## 2023-09-16 NOTE — ED Triage Notes (Signed)
Pt presents with c/o abnormal lab. Pt reports that she has been having persistent vaginal bleeding and when she went to get her lab work done, she was told her potassium was 6.1.

## 2023-09-16 NOTE — Discharge Instructions (Signed)
You were seen in the emerged department for elevated potassium Your potassium today was normal as was the last of your blood work Your pregnancy test was again negative It is important that you follow-up with a gynecologist for your dysfunctional uterine bleeding You can either follow-up with the appointment you have scheduled or call the number listed above to schedule sooner appointment here in [place] Return to the emergency department for severe bleeding, chest pain, trouble breathing or any other concerns

## 2023-10-20 DIAGNOSIS — N938 Other specified abnormal uterine and vaginal bleeding: Secondary | ICD-10-CM | POA: Diagnosis not present

## 2023-10-20 DIAGNOSIS — Z113 Encounter for screening for infections with a predominantly sexual mode of transmission: Secondary | ICD-10-CM | POA: Diagnosis not present

## 2023-10-20 DIAGNOSIS — R300 Dysuria: Secondary | ICD-10-CM | POA: Diagnosis not present

## 2023-11-05 DIAGNOSIS — D219 Benign neoplasm of connective and other soft tissue, unspecified: Secondary | ICD-10-CM | POA: Diagnosis not present

## 2023-11-05 DIAGNOSIS — N938 Other specified abnormal uterine and vaginal bleeding: Secondary | ICD-10-CM | POA: Diagnosis not present

## 2023-11-05 DIAGNOSIS — Z319 Encounter for procreative management, unspecified: Secondary | ICD-10-CM | POA: Diagnosis not present

## 2024-11-09 ENCOUNTER — Other Ambulatory Visit: Payer: Self-pay | Admitting: Medical Genetics
